# Patient Record
Sex: Male | Born: 1970 | Race: Black or African American | Hispanic: No | Marital: Married | Smoking: Former smoker
Health system: Southern US, Community
[De-identification: ages and names within clinical notes are randomized; demographics above are authoritative.]

## PROBLEM LIST (undated history)

## (undated) DIAGNOSIS — I10 Essential (primary) hypertension: Secondary | ICD-10-CM

## (undated) DIAGNOSIS — I509 Heart failure, unspecified: Secondary | ICD-10-CM

## (undated) DIAGNOSIS — J45909 Unspecified asthma, uncomplicated: Secondary | ICD-10-CM

## (undated) DIAGNOSIS — M1990 Unspecified osteoarthritis, unspecified site: Secondary | ICD-10-CM

## (undated) DIAGNOSIS — G4730 Sleep apnea, unspecified: Secondary | ICD-10-CM

## (undated) DIAGNOSIS — E119 Type 2 diabetes mellitus without complications: Secondary | ICD-10-CM

## (undated) HISTORY — DX: Unspecified asthma, uncomplicated: J45.909

---

## 1998-11-26 ENCOUNTER — Emergency Department (HOSPITAL_COMMUNITY): Admission: EM | Admit: 1998-11-26 | Discharge: 1998-11-26 | Payer: Self-pay | Admitting: Emergency Medicine

## 1998-11-26 ENCOUNTER — Encounter: Payer: Self-pay | Admitting: Emergency Medicine

## 1998-11-27 ENCOUNTER — Encounter: Payer: Self-pay | Admitting: Emergency Medicine

## 2017-05-01 ENCOUNTER — Encounter (HOSPITAL_COMMUNITY): Payer: Self-pay | Admitting: Emergency Medicine

## 2017-05-01 ENCOUNTER — Emergency Department (HOSPITAL_COMMUNITY): Payer: PRIVATE HEALTH INSURANCE

## 2017-05-01 ENCOUNTER — Inpatient Hospital Stay (HOSPITAL_COMMUNITY)
Admission: EM | Admit: 2017-05-01 | Discharge: 2017-05-03 | DRG: 291 | Disposition: A | Discharge status: Home / Self Care | Payer: PRIVATE HEALTH INSURANCE | Attending: Internal Medicine | Admitting: Internal Medicine

## 2017-05-01 DIAGNOSIS — R112 Nausea with vomiting, unspecified: Secondary | ICD-10-CM | POA: Diagnosis present

## 2017-05-01 DIAGNOSIS — I5031 Acute diastolic (congestive) heart failure: Secondary | ICD-10-CM | POA: Diagnosis present

## 2017-05-01 DIAGNOSIS — I5032 Chronic diastolic (congestive) heart failure: Secondary | ICD-10-CM

## 2017-05-01 DIAGNOSIS — A084 Viral intestinal infection, unspecified: Secondary | ICD-10-CM | POA: Diagnosis present

## 2017-05-01 DIAGNOSIS — I110 Hypertensive heart disease with heart failure: Principal | ICD-10-CM | POA: Diagnosis present

## 2017-05-01 DIAGNOSIS — R0682 Tachypnea, not elsewhere classified: Secondary | ICD-10-CM

## 2017-05-01 DIAGNOSIS — Z6841 Body Mass Index (BMI) 40.0 and over, adult: Secondary | ICD-10-CM

## 2017-05-01 DIAGNOSIS — Z8249 Family history of ischemic heart disease and other diseases of the circulatory system: Secondary | ICD-10-CM

## 2017-05-01 DIAGNOSIS — J9601 Acute respiratory failure with hypoxia: Secondary | ICD-10-CM | POA: Diagnosis present

## 2017-05-01 DIAGNOSIS — R000 Tachycardia, unspecified: Secondary | ICD-10-CM

## 2017-05-01 DIAGNOSIS — G4733 Obstructive sleep apnea (adult) (pediatric): Secondary | ICD-10-CM | POA: Diagnosis present

## 2017-05-01 DIAGNOSIS — I10 Essential (primary) hypertension: Secondary | ICD-10-CM | POA: Diagnosis present

## 2017-05-01 DIAGNOSIS — Z9981 Dependence on supplemental oxygen: Secondary | ICD-10-CM

## 2017-05-01 DIAGNOSIS — Z9119 Patient's noncompliance with other medical treatment and regimen: Secondary | ICD-10-CM

## 2017-05-01 DIAGNOSIS — Z87891 Personal history of nicotine dependence: Secondary | ICD-10-CM

## 2017-05-01 DIAGNOSIS — Z79899 Other long term (current) drug therapy: Secondary | ICD-10-CM

## 2017-05-01 DIAGNOSIS — R109 Unspecified abdominal pain: Secondary | ICD-10-CM | POA: Diagnosis present

## 2017-05-01 DIAGNOSIS — R0602 Shortness of breath: Secondary | ICD-10-CM

## 2017-05-01 DIAGNOSIS — G4730 Sleep apnea, unspecified: Secondary | ICD-10-CM

## 2017-05-01 DIAGNOSIS — F1210 Cannabis abuse, uncomplicated: Secondary | ICD-10-CM | POA: Diagnosis present

## 2017-05-01 DIAGNOSIS — R197 Diarrhea, unspecified: Secondary | ICD-10-CM

## 2017-05-01 DIAGNOSIS — R0902 Hypoxemia: Secondary | ICD-10-CM

## 2017-05-01 HISTORY — DX: Sleep apnea, unspecified: G47.30

## 2017-05-01 HISTORY — DX: Unspecified osteoarthritis, unspecified site: M19.90

## 2017-05-01 HISTORY — DX: Essential (primary) hypertension: I10

## 2017-05-01 HISTORY — DX: Heart failure, unspecified: I50.9

## 2017-05-01 LAB — CBC
HEMATOCRIT: 44.4 % (ref 39.0–52.0)
Hemoglobin: 14.5 g/dL (ref 13.0–17.0)
MCH: 27.4 pg (ref 26.0–34.0)
MCHC: 32.7 g/dL (ref 30.0–36.0)
MCV: 83.8 fL (ref 78.0–100.0)
Platelets: 220 10*3/uL (ref 150–400)
RBC: 5.3 MIL/uL (ref 4.22–5.81)
RDW: 15.4 % (ref 11.5–15.5)
WBC: 8.7 10*3/uL (ref 4.0–10.5)

## 2017-05-01 LAB — COMPREHENSIVE METABOLIC PANEL
ALT: 28 U/L (ref 17–63)
AST: 22 U/L (ref 15–41)
Albumin: 3.9 g/dL (ref 3.5–5.0)
Alkaline Phosphatase: 35 U/L — ABNORMAL LOW (ref 38–126)
Anion gap: 12 (ref 5–15)
BUN: 9 mg/dL (ref 6–20)
CHLORIDE: 102 mmol/L (ref 101–111)
CO2: 23 mmol/L (ref 22–32)
Calcium: 9.1 mg/dL (ref 8.9–10.3)
Creatinine, Ser: 0.8 mg/dL (ref 0.61–1.24)
GFR calc Af Amer: >60 mL/min (ref >60)
Glucose, Bld: 115 mg/dL — ABNORMAL HIGH (ref 65–99)
POTASSIUM: 3.9 mmol/L (ref 3.5–5.1)
SODIUM: 137 mmol/L (ref 135–145)
Total Bilirubin: 0.8 mg/dL (ref 0.3–1.2)
Total Protein: 7.7 g/dL (ref 6.5–8.1)

## 2017-05-01 LAB — LIPASE, BLOOD: Lipase: 27 U/L (ref 11–51)

## 2017-05-01 LAB — URINALYSIS, ROUTINE W REFLEX MICROSCOPIC
Bilirubin Urine: NEGATIVE
GLUCOSE, UA: NEGATIVE mg/dL
HGB URINE DIPSTICK: NEGATIVE
Ketones, ur: NEGATIVE mg/dL
LEUKOCYTES UA: NEGATIVE
Nitrite: NEGATIVE
PH: 5 (ref 5.0–8.0)
Protein, ur: NEGATIVE mg/dL
Specific Gravity, Urine: 1.023 (ref 1.005–1.030)

## 2017-05-01 LAB — D-DIMER, QUANTITATIVE: D-Dimer, Quant: 2.9 ug/mL-FEU — ABNORMAL HIGH (ref 0.00–0.50)

## 2017-05-01 MED ORDER — ONDANSETRON 4 MG PO TBDP
4.0000 mg | ORAL_TABLET | Freq: Once | ORAL | Status: DC
  Filled 2017-05-01: qty 1

## 2017-05-01 MED ORDER — ONDANSETRON 4 MG PO TBDP
4.0000 mg | ORAL_TABLET | Freq: Once | ORAL | Status: AC | PRN
  Administered 2017-05-01: 4 mg via ORAL
  Filled 2017-05-01: qty 1

## 2017-05-01 MED ORDER — LORAZEPAM 2 MG/ML IJ SOLN
1.0000 mg | Freq: Once | INTRAMUSCULAR | Status: AC
  Administered 2017-05-02: 1 mg via INTRAVENOUS
  Filled 2017-05-01: qty 1

## 2017-05-01 NOTE — ED Triage Notes (Signed)
Reports having n/v/d that started today with abdominal cramping.

## 2017-05-01 NOTE — ED Notes (Signed)
Pt given juice for fluid challenge 

## 2017-05-01 NOTE — ED Provider Notes (Cosign Needed)
[facility] Provider Note   CSN: [id number] Arrival date & time: 05/01/17  1849     History   Chief Complaint Chief Complaint  Patient presents with  . Shortness of Breath  . Generalized Body Aches  . Abdominal Pain  . Nausea    HPI William Benitez is a 47 y.o. male.  HPI  William Benitez is a 47 year old male with a history of CHF, sleep apnea, asthma and hypertension who presents to the emergency department with multiple complaints.  Patient reports that he was diagnosed with congestive heart failure a few months ago.  He takes Lasix daily, denies recent changes in his medications.  Over the past two days he states that he has felt worsening shortness of breath, particularly with laying flat.  States that his bilateral legs appear somewhat swollen as well.  He also had some associated wheezing which has improved with his inhaler.  He denies associated chest pain or cough.  Today he states that he developed gradual onset generalized abdominal pain which is now 8/10 in severity.  Reports his entire abdomen feels "tight."  He had one episode of vomiting today and has had three episodes of nonbloody diarrhea.  He denies sick contacts with similar.  States that he had subjective chills today.  [place] for his father's funeral.  He denies headache, sore throat, congestion, body aches, melena, hematochezia, he denies history of DVT/PE, calf tenderness, pleuritic chest pain, recent surgery or immobilization, history of cancer, exogenous testosterone.   Past Medical History:  Diagnosis Date  . Arthritis   . CHF (congestive heart failure) (HCC)   . Hypertension   . Sleep apnea     There are no active problems to display for this patient.   History reviewed. No pertinent surgical history.     Home Medications    Prior to Admission medications   Not on File    Family History No family history on file.  Social  History Social History   Tobacco Use  . Smoking status: Former Games developermoker  . Smokeless tobacco: Never Used  Substance Use Topics  . Alcohol use: No    Frequency: Never  . Drug use: Yes    Types: Marijuana     Allergies   Patient has no allergy information on record.   Review of Systems Review of Systems  Constitutional: Positive for chills. Negative for fever.  Eyes: Negative for visual disturbance.  Respiratory: Positive for shortness of breath and wheezing. Negative for cough.   Cardiovascular: Positive for leg swelling (bilateral). Negative for chest pain.  Gastrointestinal: Positive for abdominal pain (generalized), diarrhea, nausea and vomiting. Negative for blood in stool.  Genitourinary: Negative for dysuria, flank pain and frequency.  Musculoskeletal: Negative for gait problem.  Skin: Negative for rash.  Neurological: Negative for light-headedness and headaches.  Psychiatric/Behavioral: Negative for agitation.     Physical Exam Updated Vital Signs BP (!) 170/89   Pulse (!) 105   Temp 98.9 F (37.2 C) (Oral)   Resp (!) 25   Ht 5\' 9"  (1.753 m)   Wt (!) 157.9 kg (348 lb)   SpO2 98%   BMI 51.39 kg/m   Physical Exam  Constitutional: He is oriented to person, place, and time. He appears well-developed and well-nourished. No distress.  HENT:  Head: Normocephalic and atraumatic.  Mouth/Throat: Oropharynx is clear and moist. No oropharyngeal exudate.  Mucous membranes moist  Eyes: Conjunctivae are normal. Pupils are  equal, round, and reactive to light. Right eye exhibits no discharge. Left eye exhibits no discharge.  Neck: Normal range of motion. Neck supple.  Cardiovascular: Normal rate, regular rhythm and intact distal pulses. Exam reveals no friction rub.  No murmur heard. Pulmonary/Chest: Effort normal. No stridor. No respiratory distress. He has no wheezes. He has no rales.  No respiratory distress, speaking in full sentences. Lungs CTA.   Abdominal: Soft.  Bowel sounds are normal.  Abdomen soft and nondistended.  Generally tender to palpation throughout the abdomen.  No guarding or rigidity.  No rebound tenderness.  No fluid wave.  No CVA tenderness.  Musculoskeletal:  Bilateral legs without appreciable swelling or calf tenderness.  Neurological: He is alert and oriented to person, place, and time. Coordination normal.  Skin: Skin is warm and dry. Capillary refill takes less than 2 seconds. He is not diaphoretic.  Psychiatric: He has a normal mood and affect. His behavior is normal.  Nursing note and vitals reviewed.   ED Treatments / Results  Labs (all labs ordered are listed, but only abnormal results are displayed) Labs Reviewed  COMPREHENSIVE METABOLIC PANEL - Abnormal; Notable for the following components:      Result Value   Glucose, Bld 115 (*)    Alkaline Phosphatase 35 (*)    All other components within normal limits  D-DIMER, QUANTITATIVE (NOT AT [facility]) - Abnormal; Notable for the following components:   D-Dimer, Quant 2.90 (*)    All other components within normal limits  LIPASE, BLOOD  CBC  URINALYSIS, ROUTINE W REFLEX MICROSCOPIC  I-STAT TROPONIN, ED    EKG  EKG Interpretation  Date/Time:  Sunday May 01 2017 18:59:27 EDT Ventricular Rate:  106 PR Interval:  148 QRS Duration: 84 QT Interval:  332 QTC Calculation: 441 R Axis:   83 Text Interpretation:  Sinus tachycardia Biatrial enlargement Nonspecific T wave abnormality Abnormal ECG Confirmed by Benjiman Core [phone]) on 05/01/2017 10:36:36 PM       Radiology Dg Chest 2 View  Result Date: 05/01/2017 CLINICAL DATA:  47 year old male with shortness of breath. EXAM: CHEST - 2 VIEW COMPARISON:  None. FINDINGS: The heart size and mediastinal contours are within normal limits. Both lungs are clear. The visualized skeletal structures are unremarkable. IMPRESSION: No active cardiopulmonary disease. Electronically Signed   By: Elgie Collard M.D.   On: 05/01/2017  22:14    Procedures Procedures (including critical care time)  Medications Ordered in ED Medications  ondansetron (ZOFRAN-ODT) disintegrating tablet 4 mg (4 mg Oral Refused 05/01/17 2152)  iopamidol (ISOVUE-370) 76 % injection (not administered)  LORazepam (ATIVAN) injection 1 mg (not administered)  ondansetron (ZOFRAN-ODT) disintegrating tablet 4 mg (4 mg Oral Given 05/01/17 1911)  LORazepam (ATIVAN) injection 1 mg (1 mg Intravenous Given 05/02/17 0043)     Initial Impression / Assessment and Plan / ED Course  I have reviewed the triage vital signs and the nursing notes.  Pertinent labs & imaging results that were available during my care of the patient were reviewed by me and considered in my medical decision making (see chart for details).     Presents to the emergency department with multiple complaints.  Abdominal pain, n/v and diarrhea  This started today. Associated vomiting and diarrhea. On exam, abdomen soft and generally tender to palpation. No guarding or rigidity. No rebound. Labs reviewed. CBC, CMP, Lipase and UA unremarkable. Given symptoms started today with vomiting and diarrhea, suspect viral gastroenteritis. Patient without signs of dehydration. Given zofran in  the department and tolerating po fluids at the bedside. Have counseled patient on BRAT diet. Counseled him on strict return precautions and he agrees and voices understanding to plan.   Shortness of Breath Patient has known history of CHF. Reports his shortness of breath has been ongoing for two days now and seems to be worse with laying flat. Also endorses some wheezing, improved with albuterol. He denies chest pain. On exam he is in no apparent respiratory distress. Tachycardic. Lungs CTA. Does not appear fluid overloaded, no appreciable leg swelling.   CXR negative for acute abnormality. Plan to get ddimer given tachycardic and short of breath with clear lungs. EKG shows t-wave inversions in V5, V6 and II, III  with no previous to compare to. Awaiting troponin.   Ddimer elevated at 2.90, plan to get CT angio of chest to further evaluate for PE. Troponin negative. Given presentation and lab work do not suspect ACS. Plan to have patient follow up with his PCP regarding t-wave inversions. Discussed with Dr. Rubin Payor who agrees with the above plan.   Sign out given at shift change to [facility] for disposition following CT angio chest. If negative plan to discharge with recommendation to follow up with Cardiologist for today's ER visit.   Final Clinical Impressions(s) / ED Diagnoses   Final diagnoses:  None    ED Discharge Orders    None       Kellie Shropshire, PA-C 05/02/17 0120

## 2017-05-02 ENCOUNTER — Other Ambulatory Visit (HOSPITAL_COMMUNITY): Payer: Self-pay

## 2017-05-02 ENCOUNTER — Emergency Department (HOSPITAL_COMMUNITY): Payer: PRIVATE HEALTH INSURANCE

## 2017-05-02 ENCOUNTER — Encounter (HOSPITAL_COMMUNITY): Payer: Self-pay | Admitting: Internal Medicine

## 2017-05-02 ENCOUNTER — Inpatient Hospital Stay (HOSPITAL_COMMUNITY): Payer: PRIVATE HEALTH INSURANCE

## 2017-05-02 DIAGNOSIS — I110 Hypertensive heart disease with heart failure: Secondary | ICD-10-CM | POA: Diagnosis present

## 2017-05-02 DIAGNOSIS — I5032 Chronic diastolic (congestive) heart failure: Secondary | ICD-10-CM

## 2017-05-02 DIAGNOSIS — R0682 Tachypnea, not elsewhere classified: Secondary | ICD-10-CM | POA: Diagnosis present

## 2017-05-02 DIAGNOSIS — J9601 Acute respiratory failure with hypoxia: Secondary | ICD-10-CM

## 2017-05-02 DIAGNOSIS — G4730 Sleep apnea, unspecified: Secondary | ICD-10-CM | POA: Diagnosis present

## 2017-05-02 DIAGNOSIS — I509 Heart failure, unspecified: Secondary | ICD-10-CM | POA: Diagnosis not present

## 2017-05-02 DIAGNOSIS — I10 Essential (primary) hypertension: Secondary | ICD-10-CM | POA: Diagnosis present

## 2017-05-02 DIAGNOSIS — Z8249 Family history of ischemic heart disease and other diseases of the circulatory system: Secondary | ICD-10-CM | POA: Diagnosis not present

## 2017-05-02 DIAGNOSIS — R1084 Generalized abdominal pain: Secondary | ICD-10-CM

## 2017-05-02 DIAGNOSIS — Z79899 Other long term (current) drug therapy: Secondary | ICD-10-CM | POA: Diagnosis not present

## 2017-05-02 DIAGNOSIS — F1210 Cannabis abuse, uncomplicated: Secondary | ICD-10-CM | POA: Diagnosis present

## 2017-05-02 DIAGNOSIS — R112 Nausea with vomiting, unspecified: Secondary | ICD-10-CM | POA: Diagnosis present

## 2017-05-02 DIAGNOSIS — R197 Diarrhea, unspecified: Secondary | ICD-10-CM

## 2017-05-02 DIAGNOSIS — G4733 Obstructive sleep apnea (adult) (pediatric): Secondary | ICD-10-CM | POA: Diagnosis present

## 2017-05-02 DIAGNOSIS — R109 Unspecified abdominal pain: Secondary | ICD-10-CM | POA: Diagnosis present

## 2017-05-02 DIAGNOSIS — Z9119 Patient's noncompliance with other medical treatment and regimen: Secondary | ICD-10-CM | POA: Diagnosis not present

## 2017-05-02 DIAGNOSIS — I5031 Acute diastolic (congestive) heart failure: Secondary | ICD-10-CM | POA: Diagnosis present

## 2017-05-02 DIAGNOSIS — Z87891 Personal history of nicotine dependence: Secondary | ICD-10-CM | POA: Diagnosis not present

## 2017-05-02 DIAGNOSIS — A084 Viral intestinal infection, unspecified: Secondary | ICD-10-CM | POA: Diagnosis present

## 2017-05-02 DIAGNOSIS — Z6841 Body Mass Index (BMI) 40.0 and over, adult: Secondary | ICD-10-CM | POA: Diagnosis not present

## 2017-05-02 LAB — RAPID URINE DRUG SCREEN, HOSP PERFORMED
Amphetamines: NOT DETECTED
BARBITURATES: NOT DETECTED
Benzodiazepines: POSITIVE — AB
COCAINE: NOT DETECTED
OPIATES: NOT DETECTED
Tetrahydrocannabinol: POSITIVE — AB

## 2017-05-02 LAB — TROPONIN I: Troponin I: <0.03 ng/mL (ref <0.03)

## 2017-05-02 LAB — BRAIN NATRIURETIC PEPTIDE: B NATRIURETIC PEPTIDE 5: 48.9 pg/mL (ref 0.0–100.0)

## 2017-05-02 LAB — HIV ANTIBODY (ROUTINE TESTING W REFLEX): HIV Screen 4th Generation wRfx: NONREACTIVE

## 2017-05-02 LAB — I-STAT TROPONIN, ED: Troponin i, poc: 0 ng/mL (ref 0.00–0.08)

## 2017-05-02 LAB — INFLUENZA PANEL BY PCR (TYPE A & B)
INFLAPCR: NEGATIVE
Influenza B By PCR: NEGATIVE

## 2017-05-02 MED ORDER — ALPRAZOLAM 0.25 MG PO TABS
0.2500 mg | ORAL_TABLET | Freq: Two times a day (BID) | ORAL | Status: DC | PRN
  Administered 2017-05-02: 0.25 mg via ORAL
  Filled 2017-05-02: qty 1

## 2017-05-02 MED ORDER — SODIUM CHLORIDE 0.9 % IV SOLN: 250.0000 mL | INTRAVENOUS | Status: DC | PRN

## 2017-05-02 MED ORDER — HYDRALAZINE HCL 20 MG/ML IJ SOLN: 5.0000 mg | INTRAMUSCULAR | Status: DC | PRN

## 2017-05-02 MED ORDER — ENOXAPARIN SODIUM 40 MG/0.4ML ~~LOC~~ SOLN
40.0000 mg | SUBCUTANEOUS | Status: DC
  Administered 2017-05-02 – 2017-05-03 (×2): 40 mg via SUBCUTANEOUS
  Filled 2017-05-02 (×3): qty 0.4

## 2017-05-02 MED ORDER — LISINOPRIL 2.5 MG PO TABS: 5.0000 mg | ORAL_TABLET | Freq: Every day | ORAL | Status: DC

## 2017-05-02 MED ORDER — LEVALBUTEROL HCL 1.25 MG/0.5ML IN NEBU
1.2500 mg | INHALATION_SOLUTION | Freq: Four times a day (QID) | RESPIRATORY_TRACT | Status: DC
  Filled 2017-05-02: qty 0.5

## 2017-05-02 MED ORDER — DM-GUAIFENESIN ER 30-600 MG PO TB12: 1.0000 | ORAL_TABLET | Freq: Two times a day (BID) | ORAL | Status: DC | PRN

## 2017-05-02 MED ORDER — LEVALBUTEROL HCL 1.25 MG/0.5ML IN NEBU
1.2500 mg | INHALATION_SOLUTION | Freq: Two times a day (BID) | RESPIRATORY_TRACT | Status: DC
  Administered 2017-05-03 (×2): 1.25 mg via RESPIRATORY_TRACT
  Filled 2017-05-02 (×2): qty 0.5

## 2017-05-02 MED ORDER — HYDRALAZINE HCL 20 MG/ML IJ SOLN
10.0000 mg | Freq: Once | INTRAMUSCULAR | Status: AC
  Administered 2017-05-02: 10 mg via INTRAVENOUS
  Filled 2017-05-02: qty 1

## 2017-05-02 MED ORDER — ACETAMINOPHEN 325 MG PO TABS
650.0000 mg | ORAL_TABLET | ORAL | Status: DC | PRN
  Administered 2017-05-02 – 2017-05-03 (×2): 650 mg via ORAL
  Filled 2017-05-02 (×2): qty 2

## 2017-05-02 MED ORDER — ASPIRIN EC 81 MG PO TBEC
81.0000 mg | DELAYED_RELEASE_TABLET | Freq: Every day | ORAL | Status: DC
  Administered 2017-05-02 – 2017-05-03 (×2): 81 mg via ORAL
  Filled 2017-05-02 (×2): qty 1

## 2017-05-02 MED ORDER — ZOLPIDEM TARTRATE 5 MG PO TABS: 5.0000 mg | ORAL_TABLET | Freq: Every evening | ORAL | Status: DC | PRN

## 2017-05-02 MED ORDER — BUDESONIDE 0.25 MG/2ML IN SUSP
0.2500 mg | Freq: Two times a day (BID) | RESPIRATORY_TRACT | Status: DC
  Administered 2017-05-02 – 2017-05-03 (×3): 0.25 mg via RESPIRATORY_TRACT
  Filled 2017-05-02 (×6): qty 2

## 2017-05-02 MED ORDER — LEVALBUTEROL HCL 1.25 MG/0.5ML IN NEBU: 1.2500 mg | INHALATION_SOLUTION | Freq: Four times a day (QID) | RESPIRATORY_TRACT | Status: DC | PRN

## 2017-05-02 MED ORDER — LISINOPRIL 10 MG PO TABS
10.0000 mg | ORAL_TABLET | Freq: Every day | ORAL | Status: DC
  Administered 2017-05-02 – 2017-05-03 (×2): 10 mg via ORAL
  Filled 2017-05-02 (×2): qty 1

## 2017-05-02 MED ORDER — LEVALBUTEROL HCL 1.25 MG/0.5ML IN NEBU
0.6300 mg | INHALATION_SOLUTION | Freq: Four times a day (QID) | RESPIRATORY_TRACT | Status: DC
  Administered 2017-05-02 (×2): 0.63 mg via RESPIRATORY_TRACT
  Filled 2017-05-02: qty 0.5
  Filled 2017-05-02 (×3): qty 0.26

## 2017-05-02 MED ORDER — IOPAMIDOL (ISOVUE-370) INJECTION 76%
INTRAVENOUS | Status: AC
  Administered 2017-05-02: 100 mL
  Filled 2017-05-02: qty 100

## 2017-05-02 MED ORDER — IOPAMIDOL (ISOVUE-300) INJECTION 61%
INTRAVENOUS | Status: AC
  Administered 2017-05-02: 18:00:00
  Filled 2017-05-02: qty 30

## 2017-05-02 MED ORDER — MORPHINE SULFATE (PF) 4 MG/ML IV SOLN: 2.0000 mg | INTRAVENOUS | Status: DC | PRN

## 2017-05-02 MED ORDER — BECLOMETHASONE DIPROPIONATE 40 MCG/ACT IN AERS: 1.0000 | INHALATION_SPRAY | Freq: Two times a day (BID) | RESPIRATORY_TRACT | Status: DC

## 2017-05-02 MED ORDER — SODIUM CHLORIDE 0.9% FLUSH: 3.0000 mL | INTRAVENOUS | Status: DC | PRN

## 2017-05-02 MED ORDER — FUROSEMIDE 10 MG/ML IJ SOLN
40.0000 mg | Freq: Two times a day (BID) | INTRAMUSCULAR | Status: DC
  Administered 2017-05-02 – 2017-05-03 (×3): 40 mg via INTRAVENOUS
  Filled 2017-05-02 (×4): qty 4

## 2017-05-02 MED ORDER — SODIUM CHLORIDE 0.9% FLUSH
3.0000 mL | Freq: Two times a day (BID) | INTRAVENOUS | Status: DC
  Administered 2017-05-02 – 2017-05-03 (×3): 3 mL via INTRAVENOUS

## 2017-05-02 MED ORDER — NITROGLYCERIN 0.4 MG SL SUBL: 0.4000 mg | SUBLINGUAL_TABLET | SUBLINGUAL | Status: DC | PRN

## 2017-05-02 MED ORDER — LORAZEPAM 2 MG/ML IJ SOLN
1.0000 mg | Freq: Once | INTRAMUSCULAR | Status: AC
  Administered 2017-05-02: 1 mg via INTRAVENOUS
  Filled 2017-05-02: qty 1

## 2017-05-02 MED ORDER — ONDANSETRON HCL 4 MG/2ML IJ SOLN: 4.0000 mg | Freq: Four times a day (QID) | INTRAMUSCULAR | Status: DC | PRN

## 2017-05-02 NOTE — ED Notes (Signed)
Cranberry juice 

## 2017-05-02 NOTE — ED Notes (Signed)
Pt's O2 sat noted to be in the 80's.  Pt w/ hx OSA.  2L O2 via  applied to pt w/ an improvement in O2 sats.

## 2017-05-02 NOTE — ED Notes (Signed)
Dinner tray delivered.

## 2017-05-02 NOTE — Care Management Note (Signed)
Case Management Note  Patient Details  Name: William Benitez MRN: [id number] Date of Birth: 1970-04-05  Subjective/Objective:                  47 y.o. male with medical history significant of hypertension, CHF, OSA on CPAP, marijuana abuse, morbid obesity, who presents with shortness breath, nausea, vomiting, diarrhea, abdominal pain. From home [facility]  Action/Plan: Admit status INPATIENT (ACUTE RESP FAILURE); anticipate discharge HOME WITH SELF CARE.   Expected Discharge Date:  (unknown)               Expected Discharge Plan:  Home/Self Care  In-House Referral:     Discharge planning Services  CM Consult  Post Acute Care Choice:    Choice offered to:     DME Arranged:    DME Agency:     HH Arranged:    HH Agency:     Status of Service:  In process, will continue to follow  If discussed at Long Length of Stay Meetings, dates discussed:    Additional Comments:  Oletta CohnWood, Matteson Blue, RN 05/02/2017, 11:31 AM

## 2017-05-02 NOTE — Progress Notes (Signed)
   Follow Up Note  HPI: 47 year old male past medical history of hypertension, morbid obesity, obstructive sleep apnea and diastolic CHF admitted for progressively worsening shortness of breath of unclear etiology.  Chest x-ray unrevealing for pneumonia.  Started on IV Lasix, admitted to the hospitalist service.  Starting to feel better  Pt admitted earlier this morning.  Seen after arrived to floor.    Exam: CV: Regular rate and rhythm, S1-S2, 2 out of 6 systolic ejection murmur Lungs: Decreased breath sounds throughout Abd: Soft, obese, nontender, positive bowel sounds Ext: 1-2+ pitting edema  Present on Admission: . Sleep apnea: Noncompliant with CPAP, will start nightly CPAP. Marland Kitchen. Hypertension: Improved as he is diuresing . Acute respiratory failure with hypoxia (HCC) To be secondary to acute heart failure: No echocardiogram on chart, will check.  Continue IV Lasix . Abdominal pain: CT scan of abdomen and pelvis unremarkable. . Nausea, vomiting and diarrhea: Flu titer negative   Disposition: Here for several days and to fully diuresed, breathing stabilized

## 2017-05-02 NOTE — H&P (Signed)
History and Physical    William Benitez ZOX:[id number] DOB: [birth year] DOA: 05/01/2017  Referring MD/NP/PA:   PCP: Patient, No Pcp Per   Patient coming from:  The patient is coming from home.  At baseline, pt is independent for most of ADL  Chief Complaint: Shortness of breath, CP, nausea, vomiting, diarrhea, abdominal pain  HPI: William Benitez is a 47 y.o. male with medical history significant of hypertension, CHF, OSA on CPAP, marijuana abuse, morbid obesity, who presents with shortness breath, nausea, vomiting, diarrhea, abdominal pain.  Patient states that he has been having shortness of breath in the past several days. He also reports chest pain, which is located in the substernal area, initially 9 out of 10 in severity, currently chest pain-free, pressure-like, nonradiating. No tenderness in the calf areas. Patient states that he nausea, vomiting, diarrhea and abdominal pain. He vomited once, and has 3 watery diarrhea. No recent antibiotics use. His abdominal pain is diffuse, constant, 7 out of 10 in severity, sharp, nonradiating. No symptoms of UTI or unilateral weakness. Patient states that he is a taking Lasix 40 mg twice a day, but he has gained 8 pounds weight recently.  ED Course: pt was found to have WBC 8.7, negative troponin, negative urinalysis, negative flu PCR, electrolytes renal function okay, temperature 90.9, tachycardia, tachypnea, oxygen saturation 80% on room air, negative chest x-ray. D-dimer positive, but CT angiogram is negative for PE. Patient is admitted to telemetry bed as inpatient.    Review of Systems:   General: no fevers, chills, has body weight gain, has poor appetite, has fatigue HEENT: no blurry vision, hearing changes or sore throat Respiratory: has dyspnea, coughing, no  wheezing CV: has chest pain, no palpitations GI: has nausea, vomiting, abdominal pain, diarrhea, no constipation GU: no dysuria, burning on urination, increased urinary frequency,  hematuria  Ext: has mild leg edema Neuro: no unilateral weakness, numbness, or tingling, no vision change or hearing loss Skin: no rash, no skin tear. MSK: No muscle spasm, no deformity, no limitation of range of movement in spin Heme: No easy bruising.  Travel history: No recent long distant travel.  Allergy: No Known Allergies  Past Medical History:  Diagnosis Date  . Arthritis   . CHF (congestive heart failure) (HCC)   . Hypertension   . Sleep apnea     History reviewed. No pertinent surgical history.  Social History:  reports that he has quit smoking. he has never used smokeless tobacco. He reports that he uses drugs. Drug: Marijuana. He reports that he does not drink alcohol.  Family History:  Family History  Problem Relation Age of Onset  . Hypertension Mother   . Liver disease Mother   . Kidney disease Father      Prior to Admission medications   Medication Sig Start Date End Date Taking? Authorizing Provider  albuterol (PROVENTIL HFA;VENTOLIN HFA) 108 (90 Base) MCG/ACT inhaler Inhale 1-2 puffs into the lungs every 6 (six) hours as needed for wheezing or shortness of breath.   Yes [provider]  beclomethasone (QVAR) 40 MCG/ACT inhaler Inhale 1 puff into the lungs 2 (two) times daily.   Yes [provider]  furosemide (LASIX) 40 MG tablet Take 40 mg by mouth 2 (two) times daily.   Yes [provider]    Physical Exam: Vitals:   05/02/17 0430 05/02/17 0445 05/02/17 0515 05/02/17 0530  BP: (!) 175/105 (!) 158/84 (!) 146/96 (!) 143/80  Pulse:  (!) 124 (!) 133 (!) 135  Resp: 15 (!) 35 (!) 34 15  Temp:      TempSrc:      SpO2:  100% 98% 99%  Weight:      Height:       General: Not in acute distress HEENT:       Eyes: PERRL, EOMI, no scleral icterus.       ENT: No discharge from the ears and nose, no pharynx injection, no tonsillar enlargement.        Neck: Difficult to assess JVD, no bruit, no mass felt. Heme: No neck lymph node  enlargement. Cardiac: S1/S2, RRR, No murmurs, No gallops or rubs. Respiratory: No rales, wheezing, rhonchi or rubs. GI: Soft, nondistended, nontender, no rebound pain, no organomegaly, BS present. GU: No hematuria Ext: has trace leg edema bilaterally. 2+DP/PT pulse bilaterally. Musculoskeletal: No joint deformities, No joint redness or warmth, no limitation of ROM in spin. Skin: No rashes.  Neuro: Alert, oriented X3, cranial nerves II-XII grossly intact, moves all extremities normally. Psych: Patient is not psychotic, no suicidal or hemocidal ideation.  Labs on Admission: I have personally reviewed following labs and imaging studies  CBC: Recent Labs  Lab 05/01/17 1918  WBC 8.7  HGB 14.5  HCT 44.4  MCV 83.8  PLT 220   Basic Metabolic Panel: Recent Labs  Lab 05/01/17 1918  NA 137  K 3.9  CL 102  CO2 23  GLUCOSE 115*  BUN 9  CREATININE 0.80  CALCIUM 9.1   GFR: Estimated Creatinine Clearance: 176 mL/min (by C-G formula based on SCr of 0.8 mg/dL). Liver Function Tests: Recent Labs  Lab 05/01/17 1918  AST 22  ALT 28  ALKPHOS 35*  BILITOT 0.8  PROT 7.7  ALBUMIN 3.9   Recent Labs  Lab 05/01/17 1918  LIPASE 27   No results for input(s): AMMONIA in the last 168 hours. Coagulation Profile: No results for input(s): INR, PROTIME in the last 168 hours. Cardiac Enzymes: No results for input(s): CKTOTAL, CKMB, CKMBINDEX, TROPONINI in the last 168 hours. BNP (last 3 results) No results for input(s): PROBNP in the last 8760 hours. HbA1C: No results for input(s): HGBA1C in the last 72 hours. CBG: No results for input(s): GLUCAP in the last 168 hours. Lipid Profile: No results for input(s): CHOL, HDL, LDLCALC, TRIG, CHOLHDL, LDLDIRECT in the last 72 hours. Thyroid Function Tests: No results for input(s): TSH, T4TOTAL, FREET4, T3FREE, THYROIDAB in the last 72 hours. Anemia Panel: No results for input(s): VITAMINB12, FOLATE, FERRITIN, TIBC, IRON, RETICCTPCT in the  last 72 hours. Urine analysis:    Component Value Date/Time   COLORURINE YELLOW 05/01/2017 1919   APPEARANCEUR CLEAR 05/01/2017 1919   LABSPEC 1.023 05/01/2017 1919   PHURINE 5.0 05/01/2017 1919   GLUCOSEU NEGATIVE 05/01/2017 1919   HGBUR NEGATIVE 05/01/2017 1919   BILIRUBINUR NEGATIVE 05/01/2017 1919   KETONESUR NEGATIVE 05/01/2017 1919   PROTEINUR NEGATIVE 05/01/2017 1919   NITRITE NEGATIVE 05/01/2017 1919   LEUKOCYTESUR NEGATIVE 05/01/2017 1919   Sepsis Labs: @LABRCNTIP (procalcitonin:4,lacticidven:4) )No results found for this or any previous visit (from the past 240 hour(s)).   Radiological Exams on Admission: Dg Chest 2 View  Result Date: 05/01/2017 CLINICAL DATA:  47 year old male with shortness of breath. EXAM: CHEST - 2 VIEW COMPARISON:  None. FINDINGS: The heart size and mediastinal contours are within normal limits. Both lungs are clear. The visualized skeletal structures are unremarkable. IMPRESSION: No active cardiopulmonary disease. Electronically Signed   By: Elgie CollardArash  Radparvar M.D.   On: 05/01/2017 22:14  Ct Angio Chest Pe W And/or Wo Contrast  Result Date: 05/02/2017 CLINICAL DATA:  Elevated D-dimer.  Assess for pulmonary embolus. EXAM: CT ANGIOGRAPHY CHEST WITH CONTRAST TECHNIQUE: Multidetector CT imaging of the chest was performed using the standard protocol during bolus administration of intravenous contrast. Multiplanar CT image reconstructions and MIPs were obtained to evaluate the vascular anatomy. CONTRAST:  ISOVUE-370 IOPAMIDOL (ISOVUE-370) INJECTION 76% COMPARISON:  Chest radiograph performed 05/01/2017 FINDINGS: Cardiovascular:  There is no evidence of pulmonary embolus. The heart is normal in size. The thoracic aorta is unremarkable. The great vessels are within normal limits. Mediastinum/Nodes: The mediastinum is unremarkable in appearance. No mediastinal lymphadenopathy is seen. No pericardial effusion is identified. The visualized portions of the thyroid  gland are unremarkable. No axillary lymphadenopathy is seen. Lungs/Pleura: A small calcified granuloma is noted at the right lung base. The lungs are otherwise clear. No focal consolidation, pleural effusion or pneumothorax is seen. No masses are identified. Upper Abdomen: The visualized portions of the liver and spleen are unremarkable. Musculoskeletal: No acute osseous abnormalities are identified. The visualized musculature is unremarkable in appearance. Review of the MIP images confirms the above findings. IMPRESSION: No evidence of pulmonary embolus.  Lungs clear bilaterally. Electronically Signed   By: Roanna Raider M.D.   On: 05/02/2017 01:40     EKG: Independently reviewed.  Sinus rhythm, QTC 441, LAE, mild T-wave inversion in inferior leads, V5 to V6  Assessment/Plan Principal Problem:   Acute respiratory failure with hypoxia (HCC) Active Problems:   CHF (congestive heart failure) (HCC)   Sleep apnea   Hypertension   Abdominal pain   Nausea, vomiting and diarrhea   Acute respiratory failure with hypoxia (HCC): Etiology is not clear. CT angiogram is negative for PE. Flu is negative. No fever or leukocytosis. Patient has history of CHF, but no 2-D echo on record, not sure which type of CHF. Given history of hypertension, possibly diastolic congestive heart failure. Patient states that he has gain 8 pounds recently, indicating possible CHF exacerbation. It is very difficult to assess volume status due to morbid obesity. Will treat patient empirically has CHF exacerbation. Currently no CP.  -will admit to tele bed as inpt. -Lasix 40 mg bid by IV -trop x 3 -2d echo -start ASA -start lisinopril 10 mg daily -Daily weights -strict I/O's -Low salt diet  Hx of CHF (congestive heart failure) (HCC): diagnosed in [facility] [place] 10/[place]. -will request medical records -See above  OSA: -CPAP  HTN: bp is 175/105 -start lisinopril 10 mg daily -IV hydralazine when  necessary  Abdominal pain, Nausea, vomiting and diarrhea: Possible viral gastroenteritis. No fever or leukocytosis. No recent antibiotics use. Low suspicion for C. difficile colitis. -When necessary Zofran for nausea, -CT-abd/pelvis without contrast (since pt just had CAT)   DVT ppx: SQ Lovenox Code Status: Full code Family Communication: None at bed side.    Disposition Plan:  Anticipate discharge back to previous home environment Consults called:  none Admission status: Inpatient/tele      Date of Service 05/02/2017    Lorretta Harp [facility] Pager [phone]  If 7PM-7AM, please contact night-coverage [email or web address] Password [facility] 05/02/2017, 5:52 AM

## 2017-05-02 NOTE — ED Notes (Signed)
Ordered pt a dinner tray 

## 2017-05-02 NOTE — ED Provider Notes (Signed)
[facility].  Please see her full H&P.  In short,  William Benitez is a 47 y.o. male presents for multiple complaints.  He has a history of CHF, sleep apnea, asthma and hypertension.  He is here today with shortness of breath, worse with laying flat and associated wheezing.  Additionally, he has generalized abdominal pain with one episode of vomiting and 3 episodes of diarrhea.  No known sick contacts.    Physical Exam  BP (!) 170/89   Pulse (!) 105   Temp 98.9 F (37.2 C) (Oral)   Resp (!) 25   Ht 5\' 9"  (1.753 m)   Wt (!) 157.9 kg (348 lb)   SpO2 98%   BMI 51.39 kg/m   Physical Exam  Constitutional: He appears well-developed and well-nourished. No distress.  HENT:  Head: Normocephalic.  Eyes: Conjunctivae are normal. No scleral icterus.  Neck: Normal range of motion.  Cardiovascular: Intact distal pulses. Tachycardia present.  Pulses:      Radial pulses are 2+ on the right side, and 2+ on the left side.  Pulmonary/Chest: Effort normal.  Musculoskeletal: Normal range of motion.  Neurological: He is alert.  Skin: Skin is warm and dry.  Nursing note and vitals reviewed.   ED Course/Procedures   Clinical Course as of May 02 604  Mon May 02, 2017  0206 Plan: Patient is pending CT angiogram for shortness of breath, tachycardia and elevated d-dimer at 2.90.  [HM]  0226 Patient now with worsening tachycardia and tachypnea.  Hypoxic to 80% on room air and requiring oxygen, 3lpm via [place].  He is difficult to arouse. Pulse Rate: (!) 130 [HM]  0227 CT scan is without evidence of pulmonary embolism or pulmonary edema. CT Angio Chest PE W and/or Wo Contrast [HM]  0424 Patient is significantly more arousable now.  He is able to stand and take several steps in the room without difficulty.  However, he remains significantly tachycardic, tachypneic and hypertensive.  He denies chest pain or shortness of breath at this time.  He also continues to require oxygen.  Repeat breath  sounds are without wheezing, rhonchi or rales.  Will give additional dose of hydralazine.  [HM]  [phone] Discussed with Dr. Clyde LundborgNiu who will admit  [HM]    Clinical Course User Index [HM] Zae Kirtz, Boyd KerbsHannah, PA-C    Procedures  MDM    Patient presents with shortness of breath, worse with lying flat onset several days ago.  Initial workup was negative for acute coronary syndrome with reassuring EKG and negative troponin.  Patient denies chest pain.  No additional emesis here in the emergency department.  Patient did have an elevated d-dimer at 2.9.  Secondary to this and his tachycardia a CT angiogram of his chest was obtained.  There is no evidence of pulmonary edema or pulmonary embolism.  However, patient continues to be tachycardic and significantly tachypneic.  His breath sounds are nonfocal.  He continues to require oxygen at 3 L/min via nasal cannula to prevent hypoxia.  He was given hydralazine for his hypertension without significant improvement.  Will re-dose.  Patient is afebrile.  Influenza screen is negative.  He will need admission for further evaluation.    SOB (shortness of breath)  Tachycardia  Tachypnea  Hypoxia  Essential hypertension  Oxygen dependent  Sleep apnea, unspecified type    Dierdre ForthMuthersbaugh, Oluwatobi Ruppe, PA-C 05/02/17 0606    Gilda CreasePollina, Christopher J, MD 05/02/17 [phone]

## 2017-05-02 NOTE — ED Notes (Signed)
Pt requested something for his nerves before going to CT, up on side of bed to void

## 2017-05-02 NOTE — ED Notes (Signed)
Patient transported to CT 

## 2017-05-02 NOTE — Discharge Instructions (Signed)
The scan of your chest _____. Blood work was reassuring.   Please follow up with your cardiologist regarding today's ER visit.   For your stomach pain and nausea, I have written a prescription for Zofran _____. Please take this as needed for nausea. Please also stay away from greasy or spicy foods.   Your blood pressure was elevated in the ER today, please have this rechecked by your regular doctor.   Return to the ER if you have worsening shortness of breath, chest pain, worsening abdominal pain or vomiting that will not stop.

## 2017-05-02 NOTE — ED Notes (Signed)
ED Provider at bedside. 

## 2017-05-03 ENCOUNTER — Inpatient Hospital Stay (HOSPITAL_COMMUNITY): Payer: PRIVATE HEALTH INSURANCE

## 2017-05-03 ENCOUNTER — Other Ambulatory Visit (HOSPITAL_COMMUNITY): Payer: PRIVATE HEALTH INSURANCE

## 2017-05-03 DIAGNOSIS — I5031 Acute diastolic (congestive) heart failure: Secondary | ICD-10-CM

## 2017-05-03 LAB — ECHOCARDIOGRAM COMPLETE
HEIGHTINCHES: 69 in
Weight: 5449.77 oz

## 2017-05-03 LAB — BASIC METABOLIC PANEL
Anion gap: 10 (ref 5–15)
BUN: 10 mg/dL (ref 6–20)
CALCIUM: 8.8 mg/dL — AB (ref 8.9–10.3)
CO2: 27 mmol/L (ref 22–32)
CREATININE: 0.93 mg/dL (ref 0.61–1.24)
Chloride: 98 mmol/L — ABNORMAL LOW (ref 101–111)
GFR calc non Af Amer: >60 mL/min (ref >60)
Glucose, Bld: 144 mg/dL — ABNORMAL HIGH (ref 65–99)
Potassium: 3.3 mmol/L — ABNORMAL LOW (ref 3.5–5.1)
Sodium: 135 mmol/L (ref 135–145)

## 2017-05-03 MED ORDER — FUROSEMIDE 40 MG PO TABS
40.0000 mg | ORAL_TABLET | Freq: Once | ORAL | Status: AC
  Administered 2017-05-03: 40 mg via ORAL
  Filled 2017-05-03: qty 1

## 2017-05-03 MED ORDER — LISINOPRIL 5 MG PO TABS: 5.0000 mg | ORAL_TABLET | Freq: Every day | ORAL | 1 refills | Status: DC

## 2017-05-03 NOTE — Discharge Summary (Signed)
Discharge Summary  William Benitez VOZ:[id number] DOB: 29-Jul-1970  PCP: Patient, No Pcp Per  Admit date: 05/02/2017 Discharge date: 05/03/2017  Time spent: 25 minutes   Recommendations for Outpatient Follow-up:  1. Pt will follow up with his Cardiologist [place] (where he resides) in the next 1-2 weeks  Discharge Diagnoses:  Active Hospital Problems   Diagnosis Date Noted  . Acute respiratory failure with hypoxia (HCC) 05/02/2017  . Abdominal pain 05/02/2017  . Nausea, vomiting and diarrhea 05/02/2017  . Sleep apnea   . Hypertension   . CHF (congestive heart failure) [facility](HCC)     Resolved Hospital Problems  No resolved problems to display.    Discharge Condition: Improved, being discharged to home   Diet recommendation: Heart healthy   Vitals:   05/03/17 0834 05/03/17 1348  BP:  130/67  Pulse:  (!) 110  Resp:  20  Temp:  98.6 F (37 C)  SpO2: 95% 100%    History of present illness:  47 year old male with past medical history morbid obesity, obstructive sleep apnea not on BiPAP/CPAP, CHF and marijuana use presented to the emergency room on the night of 3/10 with complaints of shortness of breath nausea and vomiting and abdominal pain.  He also reported 3 loose stools.  Workup for flu negative.  Suspected that he had viral gastroenteritis.  Patient also noted to be hypoxic.  He was diagnosed 6 months prior with heart failure at his hospital in [place].  Has been on p.o. Lasix, and despite this reported 8 pound weight gain over the last few weeks.  Patient brought in for treatment of acute heart failure.  Hospital Course:  Principal Problem:   Acute respiratory failure with hypoxia (HCC) secondary to acute diastolic heart failure: Patient underwent echocardiogram which noted preserved ejection fraction and did not comment on diastolic dysfunction.  He did however respond very well to Lasix diuresing almost 2 L.  By day of discharge, patient looked to be fully diuresed and breathing  comfortably on room air even with ambulation.  He plans to move down here soon.  He ran out of Lasix so gave him a prescription for that as well as his other medications.  Urged him to establish with PCP and cardiology after he returns.  In the meantime, he will follow-up with his cardiologist back in [place] as he is headed back there shortly. Active Problems:    Sleep apnea: Has also been noncompliant with CPAP as he does not like the mask.  I urged him to find a mask that he can tolerate this will greatly help his breathing.   Hypertension: Following diuresis, blood pressure much better controlled.  Started him on lisinopril, prescription given   Abdominal pain: Resolved after admission   Nausea, vomiting and diarrhea: Likely secondary to viral gastroenteritis, resolved after admission   Procedures:  Echocardiogram done 3/12: Diastolic dysfunction not commented on.  Preserved ejection fraction  Consultations:  None  Discharge Exam: BP 130/67 (BP Location: Right Arm)   Pulse (!) 110   Temp 98.6 F (37 C) (Oral)   Resp 20   Ht 5\' 9"  (1.753 m)   Wt (!) 154.5 kg (340 lb 9.8 oz)   SpO2 100%   BMI 50.30 kg/m   General: Alert and oriented x3, no acute distress Cardiovascular: Regular rate and rhythm, S1-S2 Respiratory: Clear to auscultation bilaterally  Discharge Instructions You were cared for by a hospitalist during your hospital stay. If you have any questions about your discharge medications  or the care you received while you were in the hospital after you are discharged, you can call the unit and asked to speak with the hospitalist on call if the hospitalist that took care of you is not available. Once you are discharged, your primary care physician will handle any further medical issues. Please note that NO REFILLS for any discharge medications will be authorized once you are discharged, as it is imperative that you return to your primary care physician (or establish a  relationship with a primary care physician if you do not have one) for your aftercare needs so that they can reassess your need for medications and monitor your lab values.  Discharge Instructions    Diet - low sodium heart healthy   Complete by:  As directed    Increase activity slowly   Complete by:  As directed      Allergies as of 05/03/2017   No Known Allergies     Medication List    TAKE these medications   albuterol 108 (90 Base) MCG/ACT inhaler Commonly known as:  PROVENTIL HFA;VENTOLIN HFA Inhale 1-2 puffs into the lungs every 6 (six) hours as needed for wheezing or shortness of breath.   atorvastatin 40 MG tablet Commonly known as:  LIPITOR Take 40 mg by mouth daily.   beclomethasone 40 MCG/ACT inhaler Commonly known as:  QVAR Inhale 1 puff into the lungs 2 (two) times daily.   carvedilol 12.5 MG tablet Commonly known as:  COREG Take 12.5 mg by mouth 2 (two) times daily with a meal.   fluticasone-salmeterol 115-21 MCG/ACT inhaler Commonly known as:  ADVAIR HFA Inhale 2 puffs into the lungs 2 (two) times daily.   furosemide 40 MG tablet Commonly known as:  LASIX Take 40 mg by mouth 2 (two) times daily.   lisinopril 5 MG tablet Commonly known as:  PRINIVIL,ZESTRIL Take 1 tablet (5 mg total) by mouth daily. Start taking on:  05/04/2017   sildenafil 20 MG tablet Commonly known as:  REVATIO Take 20 mg by mouth 3 (three) times daily. Take 3 to 5 tablets as needed one hour prior to sexual activity as needed.      No Known Allergies Follow-up Information    [facility] EMERGENCY DEPARTMENT Follow up.   Specialty:  Emergency Medicine Why:  Return to the ER with new or wosening symptoms or new conerns. Contact information: [address] [id number] mc [place] [address] [phone]           The results of significant diagnostics from this hospitalization (including imaging, microbiology, ancillary and  laboratory) are listed below for reference.    Significant Diagnostic Studies: Ct Abdomen Pelvis Wo Contrast  Result Date: 05/02/2017 CLINICAL DATA:  Diffuse abdominal pain and nausea and vomiting for 2 days. EXAM: CT ABDOMEN AND PELVIS WITHOUT CONTRAST TECHNIQUE: Multidetector CT imaging of the abdomen and pelvis was performed following the standard protocol without IV contrast. COMPARISON:  None. FINDINGS: Lower chest: No acute findings. Hepatobiliary: No mass visualized on this unenhanced exam. Tiny gallstones are seen, however there is no evidence of cholecystitis or biliary ductal dilatation. Pancreas: No mass or inflammatory process visualized on this unenhanced exam. Spleen:  Within normal limits in size. Adrenals/Urinary tract: No evidence of urolithiasis or hydronephrosis. Unremarkable appearance of bladder. Stomach/Bowel: No evidence of obstruction, inflammatory process, or abnormal fluid collections. Normal appendix visualized. Vascular/Lymphatic: No pathologically enlarged lymph nodes identified. No evidence of abdominal aortic aneurysm. Reproductive: Poor visualization due to  patient habitus and beam hardening artifact from left hip surgical hardware. Other:  None. Musculoskeletal:  No suspicious bone lesions identified. IMPRESSION: Cholelithiasis. No radiographic evidence of cholecystitis or other acute findings. Electronically Signed   By: Myles Rosenthal M.D.   On: 05/02/2017 09:00   Dg Chest 2 View  Result Date: 05/01/2017 CLINICAL DATA:  47 year old male with shortness of breath. EXAM: CHEST - 2 VIEW COMPARISON:  None. FINDINGS: The heart size and mediastinal contours are within normal limits. Both lungs are clear. The visualized skeletal structures are unremarkable. IMPRESSION: No active cardiopulmonary disease. Electronically Signed   By: Elgie Collard M.D.   On: 05/01/2017 22:14   Ct Angio Chest Pe W And/or Wo Contrast  Result Date: 05/02/2017 CLINICAL DATA:  Elevated D-dimer.   Assess for pulmonary embolus. EXAM: CT ANGIOGRAPHY CHEST WITH CONTRAST TECHNIQUE: Multidetector CT imaging of the chest was performed using the standard protocol during bolus administration of intravenous contrast. Multiplanar CT image reconstructions and MIPs were obtained to evaluate the vascular anatomy. CONTRAST:  ISOVUE-370 IOPAMIDOL (ISOVUE-370) INJECTION 76% COMPARISON:  Chest radiograph performed 05/01/2017 FINDINGS: Cardiovascular:  There is no evidence of pulmonary embolus. The heart is normal in size. The thoracic aorta is unremarkable. The great vessels are within normal limits. Mediastinum/Nodes: The mediastinum is unremarkable in appearance. No mediastinal lymphadenopathy is seen. No pericardial effusion is identified. The visualized portions of the thyroid gland are unremarkable. No axillary lymphadenopathy is seen. Lungs/Pleura: A small calcified granuloma is noted at the right lung base. The lungs are otherwise clear. No focal consolidation, pleural effusion or pneumothorax is seen. No masses are identified. Upper Abdomen: The visualized portions of the liver and spleen are unremarkable. Musculoskeletal: No acute osseous abnormalities are identified. The visualized musculature is unremarkable in appearance. Review of the MIP images confirms the above findings. IMPRESSION: No evidence of pulmonary embolus.  Lungs clear bilaterally. Electronically Signed   By: Roanna Raider M.D.   On: 05/02/2017 01:40    Microbiology: No results found for this or any previous visit (from the past 240 hour(s)).   Labs: Basic Metabolic Panel: Recent Labs  Lab 05/01/17 1918 05/03/17 0534  NA 137 135  K 3.9 3.3*  CL 102 98*  CO2 23 27  GLUCOSE 115* 144*  BUN 9 10  CREATININE 0.80 0.93  CALCIUM 9.1 8.8*   Liver Function Tests: Recent Labs  Lab 05/01/17 1918  AST 22  ALT 28  ALKPHOS 35*  BILITOT 0.8  PROT 7.7  ALBUMIN 3.9   Recent Labs  Lab 05/01/17 1918  LIPASE 27   No results  for input(s): AMMONIA in the last 168 hours. CBC: Recent Labs  Lab 05/01/17 1918  WBC 8.7  HGB 14.5  HCT 44.4  MCV 83.8  PLT 220   Cardiac Enzymes: Recent Labs  Lab 05/02/17 0543 05/02/17 1129 05/02/17 1807  TROPONINI <0.03 <0.03 <0.03   BNP: BNP (last 3 results) Recent Labs    05/02/17 0543  BNP 48.9    ProBNP (last 3 results) No results for input(s): PROBNP in the last 8760 hours.  CBG: No results for input(s): GLUCAP in the last 168 hours.     Signed:  Hollice Espy, MD [facility] 05/03/2017, 6:04 PM

## 2017-05-03 NOTE — Plan of Care (Signed)
  Progressing Clinical Measurements: Ability to maintain clinical measurements within normal limits will improve 05/03/2017 0257 - Progressing by Burtis Junesrewery, Symphoni Helbling, RN Diagnostic test results will improve 05/03/2017 0257 - Progressing by Burtis Junesrewery, Reilley Latorre, RN Respiratory complications will improve 05/03/2017 0257 - Progressing by Burtis Junesrewery, Cashus Halterman, RN Cardiovascular complication will be avoided 05/03/2017 0257 - Progressing by Burtis Junesrewery, Morrie Daywalt, RN Activity: Risk for activity intolerance will decrease 05/03/2017 0257 - Progressing by Burtis Junesrewery, Jolana Runkles, RN

## 2017-05-03 NOTE — Progress Notes (Signed)
  Echocardiogram 2D Echocardiogram has been performed.  William SkeenVijay  Nekayla Benitez 05/03/2017, 2:26 PM

## 2017-05-03 NOTE — Care Management Note (Signed)
Case Management Note  Patient Details  Name: William Benitez MRN: [id number] Date of Birth: February 22, 1971  Subjective/Objective:      CHF, OSA              Action/Plan: NCM spoke to pt. His PCP is in [place]. He is currently on Long Term Disability with his job. His plan is to move [place]. Explained he will need to check with his insurance plan with in network providers in [place]. Explained he will need to establish with a PCP here in [place]. Pt states he plans to take medications and wear CPAP as prescribed. States he does not follow a heart healthy diet or do daily weights. Explained importance of daily weights.    Expected Discharge Date:  05/03/2017               Expected Discharge Plan:  Home/Self Care  In-House Referral:  NA  Discharge planning Services  CM Consult  Post Acute Care Choice:  NA Choice offered to:  NA  DME Arranged:  N/A DME Agency:  NA  HH Arranged:  NA HH Agency:  NA  Status of Service:  Completed, signed off  If discussed at Long Length of Stay Meetings, dates discussed:    Additional Comments:  Elliot CousinShavis, Satina Jerrell Ellen, RN 05/03/2017, 2:55 PM

## 2017-05-03 NOTE — Progress Notes (Signed)
Carollee MassedMarlan Spainhour to be D/C'd home per MD order. Discussed with the patient and all questions fully answered.  VVS, Skin clean, dry and intact without evidence of skin break down, no evidence of skin tears noted.  IV catheter discontinued intact. Site without signs and symptoms of complications. Dressing and pressure applied.  An After Visit Summary was printed and given to the patient.  Patient escorted via WC, and D/C home via private auto.  Jon Gillslisa R Blaire Palomino  05/03/2017 6:10 PM

## 2017-05-03 NOTE — Progress Notes (Signed)
Patient removed cpap but still wearing oxygen while resting.

## 2017-05-03 NOTE — Progress Notes (Signed)
Pt passed his walking O2 saturation test. Pt's O2 sats maintained between 98-100% on room air while walking 100 ft pushing dinamap machine.

## 2017-05-03 NOTE — Progress Notes (Signed)
Patient admitted to room 5w19 from 5c09. Patient alert and oriented x4. Respiration even unlabored 2L oxygen via [place]. Ambulates no gait impairment. Voiding pale yellow urine, previously received iv lasix. Patient admitted for fluid overload CHF, tolerating lasix well. Denies nausea at this time. Family at bedside. Will continue to monitor.

## 2018-01-31 ENCOUNTER — Emergency Department (HOSPITAL_COMMUNITY)
Admission: EM | Admit: 2018-01-31 | Discharge: 2018-02-01 | Disposition: A | Discharge status: Home / Self Care | Payer: PRIVATE HEALTH INSURANCE | Attending: Emergency Medicine | Admitting: Emergency Medicine

## 2018-01-31 ENCOUNTER — Encounter (HOSPITAL_COMMUNITY): Payer: Self-pay | Admitting: Emergency Medicine

## 2018-01-31 ENCOUNTER — Other Ambulatory Visit: Payer: Self-pay

## 2018-01-31 DIAGNOSIS — Z87891 Personal history of nicotine dependence: Secondary | ICD-10-CM | POA: Insufficient documentation

## 2018-01-31 DIAGNOSIS — L0231 Cutaneous abscess of buttock: Secondary | ICD-10-CM | POA: Insufficient documentation

## 2018-01-31 DIAGNOSIS — I509 Heart failure, unspecified: Secondary | ICD-10-CM | POA: Insufficient documentation

## 2018-01-31 DIAGNOSIS — F1290 Cannabis use, unspecified, uncomplicated: Secondary | ICD-10-CM | POA: Insufficient documentation

## 2018-01-31 DIAGNOSIS — Z79899 Other long term (current) drug therapy: Secondary | ICD-10-CM | POA: Insufficient documentation

## 2018-01-31 DIAGNOSIS — I10 Essential (primary) hypertension: Secondary | ICD-10-CM

## 2018-01-31 DIAGNOSIS — K0889 Other specified disorders of teeth and supporting structures: Secondary | ICD-10-CM

## 2018-01-31 LAB — BASIC METABOLIC PANEL
Anion gap: 11 (ref 5–15)
BUN: 8 mg/dL (ref 6–20)
CHLORIDE: 101 mmol/L (ref 98–111)
CO2: 24 mmol/L (ref 22–32)
CREATININE: 0.83 mg/dL (ref 0.61–1.24)
Calcium: 9.2 mg/dL (ref 8.9–10.3)
GFR calc Af Amer: >60 mL/min (ref >60)
GFR calc non Af Amer: >60 mL/min (ref >60)
Glucose, Bld: 92 mg/dL (ref 70–99)
POTASSIUM: 3.7 mmol/L (ref 3.5–5.1)
SODIUM: 136 mmol/L (ref 135–145)

## 2018-01-31 LAB — CBC WITH DIFFERENTIAL/PLATELET
ABS IMMATURE GRANULOCYTES: 0.05 10*3/uL (ref 0.00–0.07)
Basophils Absolute: 0.1 10*3/uL (ref 0.0–0.1)
Basophils Relative: 1 %
EOS PCT: 2 %
Eosinophils Absolute: 0.2 10*3/uL (ref 0.0–0.5)
HEMATOCRIT: 42.3 % (ref 39.0–52.0)
HEMOGLOBIN: 13.3 g/dL (ref 13.0–17.0)
Immature Granulocytes: 1 %
LYMPHS ABS: 2.1 10*3/uL (ref 0.7–4.0)
LYMPHS PCT: 26 %
MCH: 26.1 pg (ref 26.0–34.0)
MCHC: 31.4 g/dL (ref 30.0–36.0)
MCV: 82.9 fL (ref 80.0–100.0)
MONO ABS: 0.8 10*3/uL (ref 0.1–1.0)
MONOS PCT: 9 %
Neutro Abs: 5.1 10*3/uL (ref 1.7–7.7)
Neutrophils Relative %: 61 %
Platelets: 256 10*3/uL (ref 150–400)
RBC: 5.1 MIL/uL (ref 4.22–5.81)
RDW: 14.2 % (ref 11.5–15.5)
WBC: 8.2 10*3/uL (ref 4.0–10.5)
nRBC: 0 % (ref 0.0–0.2)

## 2018-01-31 MED ORDER — LIDOCAINE-EPINEPHRINE (PF) 2 %-1:200000 IJ SOLN
10.0000 mL | Freq: Once | INTRAMUSCULAR | Status: AC
  Administered 2018-02-01: 10 mL
  Filled 2018-01-31: qty 20

## 2018-01-31 MED ORDER — IBUPROFEN 400 MG PO TABS
400.0000 mg | ORAL_TABLET | Freq: Once | ORAL | Status: AC | PRN
  Administered 2018-01-31: 400 mg via ORAL
  Filled 2018-01-31: qty 1

## 2018-01-31 MED ORDER — OXYCODONE-ACETAMINOPHEN 5-325 MG PO TABS
1.0000 | ORAL_TABLET | Freq: Once | ORAL | Status: AC
  Administered 2018-02-01: 1 via ORAL
  Filled 2018-01-31: qty 1

## 2018-01-31 NOTE — ED Triage Notes (Signed)
Pt reports an abscess in between his buttocks that started 3 days ago. Pt unable to sit and sleep d/t the pain. Pain 9/10. Pt denies any drainage. Pt has hx of htn. Pt had recent dental procedure on the left side of his face that is also causing pain.

## 2018-01-31 NOTE — ED Provider Notes (Signed)
[facility]ENC[facility] Provider Note   CSN: [id number] Arrival date & time: 01/31/18  1825     History   Chief Complaint Chief Complaint  Patient presents with  . Abscess    HPI William Benitez is a 47 y.o. male with a history of HTN, CHF, and OSA who presents to the emergency department with a chief complaint of abscess.  The patient endorses a 3-day history of pain and swelling to his right buttock.  Symptoms have been constant and worsening since onset.  He states the current pain is at 9 out of 10.  Pain is worse with sitting and sleeping on his back or applying pressure to the area.  No known alleviating factors.   He states that he felt his pain worsened today.  He reports that he had to teeth pulled on the lower left side earlier today.  He was given a prescription for Tylenol 3, but has been unable to get the prescription filled because after the procedure when his pain worsened the pain near his right buttock also started to worsen.  He denies a fever or chills.  No drainage from the area.  He denies rectal or perineum pain, penile or testicular pain or swelling, melena, or hematochezia.  He states he had a similar area many years ago that required an incision and drainage.  He also reports a history of chronic tachycardia for which she is followed by cardiology.  The history is provided by the patient. No language interpreter was used.    Past Medical History:  Diagnosis Date  . Arthritis   . CHF (congestive heart failure) (HCC)   . Hypertension   . Sleep apnea     Patient Active Problem List   Diagnosis Date Noted  . Acute respiratory failure with hypoxia (HCC) 05/02/2017  . Abdominal pain 05/02/2017  . Nausea, vomiting and diarrhea 05/02/2017  . CHF (congestive heart failure) (HCC)   . Sleep apnea   . Hypertension     History reviewed. No pertinent surgical history.      Home Medications    Prior to Admission medications     Medication Sig Start Date End Date Taking? Authorizing Provider  albuterol (PROVENTIL HFA;VENTOLIN HFA) 108 (90 Base) MCG/ACT inhaler Inhale 1-2 puffs into the lungs every 6 (six) hours as needed for wheezing or shortness of breath.    [provider]  amLODipine (NORVASC) 5 MG tablet Take 1 tablet (5 mg total) by mouth daily. 02/01/18 03/03/18  Norlan Rann A, PA-C  amoxicillin-clavulanate (AUGMENTIN) 875-125 MG tablet Take 1 tablet by mouth every 12 (twelve) hours for 7 days. 02/01/18 02/08/18  Lavere Stork A, PA-C  atorvastatin (LIPITOR) 40 MG tablet Take 40 mg by mouth daily.    [provider]  beclomethasone (QVAR) 40 MCG/ACT inhaler Inhale 1 puff into the lungs 2 (two) times daily.    [provider]  carvedilol (COREG) 12.5 MG tablet Take 12.5 mg by mouth 2 (two) times daily with a meal.    [provider]  fluticasone-salmeterol (ADVAIR HFA) 115-21 MCG/ACT inhaler Inhale 2 puffs into the lungs 2 (two) times daily.    [provider]  furosemide (LASIX) 40 MG tablet Take 40 mg by mouth 2 (two) times daily.    [provider]  lisinopril (PRINIVIL,ZESTRIL) 5 MG tablet Take 1 tablet (5 mg total) by mouth daily. 05/04/17   Hollice EspyKrishnan, Sendil K, MD  sildenafil (REVATIO) 20 MG tablet Take 20 mg  by mouth 3 (three) times daily. Take 3 to 5 tablets as needed one hour prior to sexual activity as needed.    [provider]    Family History Family History  Problem Relation Age of Onset  . Hypertension Mother   . Liver disease Mother   . Kidney disease Father     Social History Social History   Tobacco Use  . Smoking status: Former Games developer  . Smokeless tobacco: Never Used  Substance Use Topics  . Alcohol use: No    Frequency: Never  . Drug use: Yes    Types: Marijuana    Comment: occ     Allergies   Patient has no known allergies.   Review of Systems Review of Systems  Constitutional: Negative for appetite change,  chills and fever.  HENT: Positive for dental problem.   Respiratory: Negative for shortness of breath.   Cardiovascular: Negative for chest pain.  Gastrointestinal: Negative for abdominal pain, diarrhea and vomiting.  Genitourinary: Negative for dysuria.  Musculoskeletal: Negative for back pain.  Skin: Positive for wound. Negative for rash.  Allergic/Immunologic: Negative for immunocompromised state.  Neurological: Negative for weakness, numbness and headaches.  Psychiatric/Behavioral: Negative for confusion.     Physical Exam Updated Vital Signs BP (!) 174/91 (BP Location: Right Arm)   Pulse (!) 110   Temp 99.8 F (37.7 C) (Oral)   Resp 18   Ht 5\' 9"  (1.753 m)   Wt (!) 153.3 kg   SpO2 100%   BMI 49.91 kg/m   Physical Exam  Constitutional: He appears well-developed.  HENT:  Head: Normocephalic.  Mouth/Throat:    Teeth 17 and 18 are avulsed.   Eyes: Conjunctivae are normal.  Neck: Neck supple.  Cardiovascular: Normal rate and regular rhythm.  No murmur heard. Pulmonary/Chest: Effort normal. No stridor. No respiratory distress. He has no wheezes. He has no rales. He exhibits no tenderness.  Abdominal: Soft. He exhibits no distension.  Musculoskeletal: He exhibits tenderness. He exhibits no deformity.  There is a 4 x 4 area of induration and fluctuance to the right buttock. No anal involvement. No red streaking. No overlying erythema and minimal warm.   Neurological: He is alert.  Skin: Skin is warm and dry.  Psychiatric: His behavior is normal.  Nursing note and vitals reviewed.  ED Treatments / Results  Labs (all labs ordered are listed, but only abnormal results are displayed) Labs Reviewed  AEROBIC CULTURE (SUPERFICIAL SPECIMEN)  CBC WITH DIFFERENTIAL/PLATELET  BASIC METABOLIC PANEL    EKG None  Radiology No results found.  Procedures .Marland KitchenIncision and Drainage Date/Time: 02/01/2018 8:14 AM Performed by: Barkley Boards, PA-C Authorized by: Barkley Boards, PA-C   Consent:    Consent obtained:  Verbal   Consent given by:  Patient   Risks discussed:  Bleeding, incomplete drainage, pain and damage to other organs   Alternatives discussed:  No treatment Universal protocol:    Procedure explained and questions answered to patient or proxy's satisfaction: yes     Relevant documents present and verified: yes     Test results available and properly labeled: yes     Imaging studies available: yes     Required blood products, implants, devices, and special equipment available: yes     Site/side marked: yes     Immediately prior to procedure a time out was called: yes     Patient identity confirmed:  Verbally with patient Location:    Type:  Abscess Pre-procedure details:  Skin preparation:  Betadine Anesthesia (see MAR for exact dosages):    Anesthesia method:  Local infiltration   Local anesthetic:  Lidocaine 1% WITH epi Procedure type:    Complexity:  Complex Procedure details:    Incision types:  Single straight   Incision depth:  Subcutaneous   Scalpel blade:  11   Wound management:  Probed and deloculated, irrigated with saline and extensive cleaning   Drainage:  Purulent   Drainage amount:  Moderate   Packing materials:  1/4 in gauze Post-procedure details:    Patient tolerance of procedure:  Tolerated well, no immediate complications   (including critical care time)  Medications Ordered in ED Medications  ibuprofen (ADVIL,MOTRIN) tablet 400 mg (400 mg Oral Given 01/31/18 1900)  lidocaine-EPINEPHrine (XYLOCAINE W/EPI) 2 %-1:200000 (PF) injection 10 mL (10 mLs Infiltration Given 02/01/18 0052)  oxyCODONE-acetaminophen (PERCOCET/ROXICET) 5-325 MG per tablet 1 tablet (1 tablet Oral Given 02/01/18 0020)  amLODipine (NORVASC) tablet 5 mg (5 mg Oral Given 02/01/18 0056)     Initial Impression / Assessment and Plan / ED Course  I have reviewed the triage vital signs and the nursing notes.  Pertinent labs & imaging  results that were available during my care of the patient were reviewed by me and considered in my medical decision making (see chart for details).     47 year old male with a history of HTN, CHF, and OSA presenting with a superficial cutaneous abscess to the right buttock.  No constitutional symptoms.  He is tachycardic on arrival, but states this is chronic.  Afebrile in the ED.  Labs are reassuring without leukocytosis.  On exam, there is a superficial cutaneous abscess to the right buttock.  Incision and drainage performed with moderate amount of purulent bloody discharge.  The wound was packed with quarter inch iodoform gauze.  Given the location of the wound, will cover the patient with Augmentin.  I encouraged the patient to have a wound recheck in 2 to 3 days as I am concerned that the patient may contaminate the wound with a loose stool based on loose stool present around his anus on exam.  Sterile dressing was placed prior to discharge.  Patient was also hypertensive at 193/136 on arrival.  This could be due in part to pain.  Percocet given for pain control in the ED.  We will start the patient on a low dose of amlodipine as his history indicates that he was previously diagnosed with hypertension, but is not currently taking any medications.  I encouraged him to have his blood pressure rechecked when he follows up for a wound check.  Home wound care instructions have been provided.  He has been given referrals to primary care, but is also been advised to return to the ED for wound recheck or if symptoms worsen.  Strict return precautions given.  He is hemodynamically stable and in no acute distress.  He is safe for discharge home with outpatient follow-up at this time.  Final Clinical Impressions(s) / ED Diagnoses   Final diagnoses:  Abscess of buttock, right  Dentalgia  Hypertension, unspecified type    ED Discharge Orders         Ordered    amoxicillin-clavulanate (AUGMENTIN) 875-125 MG  tablet  Every 12 hours     02/01/18 0046    amLODipine (NORVASC) 5 MG tablet  Daily     02/01/18 0046           Frederik Pear A, PA-C 02/01/18 2244  Shaune Pollack, MD 02/02/18 [phone]

## 2018-02-01 MED ORDER — AMLODIPINE BESYLATE 5 MG PO TABS
5.0000 mg | ORAL_TABLET | Freq: Once | ORAL | Status: AC
  Administered 2018-02-01: 5 mg via ORAL
  Filled 2018-02-01: qty 1

## 2018-02-01 MED ORDER — AMOXICILLIN-POT CLAVULANATE 875-125 MG PO TABS: 1.0000 | ORAL_TABLET | Freq: Two times a day (BID) | ORAL | 0 refills | Status: AC

## 2018-02-01 MED ORDER — AMLODIPINE BESYLATE 5 MG PO TABS: 5.0000 mg | ORAL_TABLET | Freq: Every day | ORAL | 0 refills | Status: DC

## 2018-02-01 NOTE — ED Notes (Signed)
PT states understanding of care given, follow up care, and medication prescribed. PT ambulated from ED to car with a steady gait. 

## 2018-02-01 NOTE — Discharge Instructions (Signed)
Thank you for allowing me to care for you today in the Emergency Department.   You need to follow-up to have a wound recheck in 2 to 3 days.  You can call the number on your discharge paperwork to get established with a primary care provider, return to the ER, or go to urgent care.    To care for your wound at home, clean the area at least once daily with warm water and soap.  Pat the area dry then apply a clean gauze dressing.  Make sure to change this dressing anytime that it gets wet or soiled.  Make sure to change it at least 1 time per day.   Take 1 tablet of Augmentin 2 times daily for the next week.  You can treat your pain at home with ibuprofen or Tylenol or take 1 of the Tylenol 3's that you were given by your dentist for severe pain.  You should not work or drive while taking the Tylenol 3. Do not take more than 4000 mg of Tylenol from all sources in a 24-hour period.  Your blood pressure was high today.  This may be due in part to pain.  Please have them recheck your blood pressure when you follow-up for recheck in 2 to 3 days.  Until then, take 1 tablet of amlodipine daily.  Your first dose was given tonight in the emergency department.  Return to the emergency department if you develop fevers, chills, significantly worsening pain where your wound is, particularly after you have been taking antibiotics for 2 to 3 days, if you develop rectal pain, or other new, concerning symptoms.

## 2018-02-02 ENCOUNTER — Emergency Department (HOSPITAL_COMMUNITY)
Admission: EM | Admit: 2018-02-02 | Discharge: 2018-02-02 | Disposition: A | Discharge status: Home / Self Care | Payer: PRIVATE HEALTH INSURANCE | Attending: Emergency Medicine | Admitting: Emergency Medicine

## 2018-02-02 ENCOUNTER — Encounter (HOSPITAL_COMMUNITY): Payer: Self-pay | Admitting: Emergency Medicine

## 2018-02-02 ENCOUNTER — Other Ambulatory Visit: Payer: Self-pay

## 2018-02-02 DIAGNOSIS — I509 Heart failure, unspecified: Secondary | ICD-10-CM | POA: Insufficient documentation

## 2018-02-02 DIAGNOSIS — Z79899 Other long term (current) drug therapy: Secondary | ICD-10-CM | POA: Insufficient documentation

## 2018-02-02 DIAGNOSIS — Z5189 Encounter for other specified aftercare: Secondary | ICD-10-CM

## 2018-02-02 DIAGNOSIS — I110 Hypertensive heart disease with heart failure: Secondary | ICD-10-CM | POA: Insufficient documentation

## 2018-02-02 DIAGNOSIS — Z87891 Personal history of nicotine dependence: Secondary | ICD-10-CM | POA: Insufficient documentation

## 2018-02-02 DIAGNOSIS — Z4800 Encounter for change or removal of nonsurgical wound dressing: Secondary | ICD-10-CM | POA: Insufficient documentation

## 2018-02-02 NOTE — Discharge Instructions (Signed)
Continue your antibiotics as directed. Return to ED for worsening symptoms, fevers, increased swelling or drainage from the area.

## 2018-02-02 NOTE — ED Triage Notes (Signed)
Pt has abscess drained 12/10, states he had bloody drainage today and wants it re checked. Denies fevers

## 2018-02-02 NOTE — ED Provider Notes (Signed)
[facility] EMERGENCY DEPARTMENT Provider Note   CSN: [id number] Arrival date & time: 02/02/18  1632     History   Chief Complaint Chief Complaint  Patient presents with  . Abscess    HPI William Benitez is a 47 y.o. male with a past medical history of hypertension presents to ED for recheck of abscess that was drained approximately 1.5 days ago.  States that the area was packed however, he does not have anyone that can reach the area to change the packing or recheck it.  States that he was told to come to the ED for a recheck.  He has been taking his antibiotics as prescribed.  He denies any fevers, chills, other complaints.  HPI  Past Medical History:  Diagnosis Date  . Arthritis   . CHF (congestive heart failure) (HCC)   . Hypertension   . Sleep apnea     Patient Active Problem List   Diagnosis Date Noted  . Acute respiratory failure with hypoxia (HCC) 05/02/2017  . Abdominal pain 05/02/2017  . Nausea, vomiting and diarrhea 05/02/2017  . CHF (congestive heart failure) (HCC)   . Sleep apnea   . Hypertension     History reviewed. No pertinent surgical history.      Home Medications    Prior to Admission medications   Medication Sig Start Date End Date Taking? Authorizing Provider  albuterol (PROVENTIL HFA;VENTOLIN HFA) 108 (90 Base) MCG/ACT inhaler Inhale 1-2 puffs into the lungs every 6 (six) hours as needed for wheezing or shortness of breath.    [provider]  amLODipine (NORVASC) 5 MG tablet Take 1 tablet (5 mg total) by mouth daily. 02/01/18 03/03/18  McDonald, Mia A, PA-C  amoxicillin-clavulanate (AUGMENTIN) 875-125 MG tablet Take 1 tablet by mouth every 12 (twelve) hours for 7 days. 02/01/18 02/08/18  McDonald, Mia A, PA-C  atorvastatin (LIPITOR) 40 MG tablet Take 40 mg by mouth daily.    [provider]  beclomethasone (QVAR) 40 MCG/ACT inhaler Inhale 1 puff into the lungs 2 (two) times daily.    [provider]    carvedilol (COREG) 12.5 MG tablet Take 12.5 mg by mouth 2 (two) times daily with a meal.    [provider]  fluticasone-salmeterol (ADVAIR HFA) 115-21 MCG/ACT inhaler Inhale 2 puffs into the lungs 2 (two) times daily.    [provider]  furosemide (LASIX) 40 MG tablet Take 40 mg by mouth 2 (two) times daily.    [provider]  lisinopril (PRINIVIL,ZESTRIL) 5 MG tablet Take 1 tablet (5 mg total) by mouth daily. 05/04/17   Hollice Espy, MD  sildenafil (REVATIO) 20 MG tablet Take 20 mg by mouth 3 (three) times daily. Take 3 to 5 tablets as needed one hour prior to sexual activity as needed.    [provider]    Family History Family History  Problem Relation Age of Onset  . Hypertension Mother   . Liver disease Mother   . Kidney disease Father     Social History Social History   Tobacco Use  . Smoking status: Former Games developer  . Smokeless tobacco: Never Used  Substance Use Topics  . Alcohol use: No    Frequency: Never  . Drug use: Yes    Types: Marijuana    Comment: occ     Allergies   Patient has no known allergies.   Review of Systems Review of Systems  Constitutional: Negative for chills and fever.  Gastrointestinal: Negative  for vomiting.  Skin: Positive for wound.     Physical Exam Updated Vital Signs BP (!) 142/98 (BP Location: Left Wrist)   Pulse (!) 116   Temp 99.9 F (37.7 C) (Oral)   Resp 16   Ht 5\' 9"  (1.753 m)   Wt (!) 153.3 kg   SpO2 99%   BMI 49.91 kg/m   Physical Exam Vitals signs and nursing note reviewed. Exam conducted with a chaperone present.  Constitutional:      General: He is not in acute distress.    Appearance: He is well-developed. He is not diaphoretic.  HENT:     Head: Normocephalic and atraumatic.  Eyes:     General: No scleral icterus.    Conjunctiva/sclera: Conjunctivae normal.  Neck:     Musculoskeletal: Normal range of motion.  Pulmonary:     Effort: Pulmonary effort is  normal. No respiratory distress.  Skin:    Findings: No rash.     Comments: 1 cm horizontal incision noted on the left buttock.  Packing in place.  No surrounding erythema, fluctuance or induration noted.  Neurological:     Mental Status: He is alert.      ED Treatments / Results  Labs (all labs ordered are listed, but only abnormal results are displayed) Labs Reviewed - No data to display  EKG None  Radiology No results found.  Procedures Procedures (including critical care time)  Medications Ordered in ED Medications - No data to display   Initial Impression / Assessment and Plan / ED Course  I have reviewed the triage vital signs and the nursing notes.  Pertinent labs & imaging results that were available during my care of the patient were reviewed by me and considered in my medical decision making (see chart for details).     47 year old male presents to ED for recheck of abscess on left buttock that was drained 1.5 days ago.  States that he was given supplies for changing the gauze but was unable to do so due to the location of the wound.  He has been taking his antibiotics as prescribed.  Denies any fevers.  On exam there appears to be a well-healing incision and drainage area.  Packing was removed with some blood noted on the packing and minimal purulent drainage.  Area was cleaned and redressed.  I do not feel that repacking is necessary at this time.  Encourage patient to continue his antibiotics.  Patient tachycardic here but chart review shows that he has been tachycardic in the past and states that "my heart rate is always high."  Will advise him to return to ED for any severe worsening symptoms.  Patient is hemodynamically stable, in NAD, and able to ambulate in the ED. Evaluation does not show pathology that would require ongoing emergent intervention or inpatient treatment. I explained the diagnosis to the patient. Pain has been managed and has no complaints prior  to discharge. Patient is comfortable with above plan and is stable for discharge at this time. All questions were answered prior to disposition. Strict return precautions for returning to the ED were discussed. Encouraged follow up with PCP.    Portions of this note were generated with Scientist, clinical (histocompatibility and immunogenetics). Dictation errors may occur despite best attempts at proofreading.   Final Clinical Impressions(s) / ED Diagnoses   Final diagnoses:  Encounter for wound re-check    ED Discharge Orders    None       Dietrich Pates, PA-C 02/02/18  1719    Terrilee FilesButler, Michael C, MD 02/03/18 1051

## 2018-02-02 NOTE — ED Notes (Signed)
Patient verbalizes understanding of discharge instructions. Opportunity for questioning and answers were provided. Armband removed by staff, pt discharged from ED.  

## 2018-02-03 LAB — AEROBIC CULTURE W GRAM STAIN (SUPERFICIAL SPECIMEN): Culture: NORMAL

## 2018-02-03 LAB — AEROBIC CULTURE  (SUPERFICIAL SPECIMEN)

## 2018-02-08 ENCOUNTER — Ambulatory Visit: Payer: PRIVATE HEALTH INSURANCE | Admitting: Family

## 2018-02-20 ENCOUNTER — Other Ambulatory Visit: Payer: Self-pay | Admitting: Family

## 2018-02-20 NOTE — Telephone Encounter (Signed)
Copied from CRM [id number]. Topic: Quick Communication - Rx Refill/Question >> Feb 20, 2018  3:55 PM Jaquita RectorDavis, Karen A wrote: Medication: furosemide (LASIX) 40 MG tablet, atorvastatin (LIPITOR) 40 MG tablet,  carvedilol (COREG) 12.5 MG tablet    Has the patient contacted their pharmacy? Yes.   (Agent: If no, request that the patient contact the pharmacy for the refill.) (Agent: If yes, when and what did the pharmacy advise?)  Preferred Pharmacy (with phone number or street name): [id number]- [place], [place] - [address] [phone] (Phone) [phone] (Fax)    Agent: Please be advised that RX refills may take up to 3 business days. We ask that you follow-up with your pharmacy.

## 2018-02-21 NOTE — Telephone Encounter (Signed)
This patient has not established care with [facility]. She has received these medications from Cardiology in the past. Refusing refills due to non established patient. TC to patient and number on file unable to leave a VM.

## 2018-02-21 NOTE — Telephone Encounter (Signed)
Charting error with previous note. Patient has not established care [facility].  Refill request refused at this time.

## 2018-02-27 ENCOUNTER — Telehealth: Payer: Self-pay | Admitting: Internal Medicine

## 2018-02-27 NOTE — Telephone Encounter (Signed)
Copied from CRM [id number]. Topic: General - Other >> Feb 27, 2018  3:12 PM Lynne Logan D wrote: Reason for CRM: Pt would like to establish care with Dr. Lawerance Bach as his PCP. Stated when he made New Patient Appt he thought he was establishing with her and not Ria Clock. He was recommended by family member Barton Fanny who he stated is a current pt of Dr. Lawerance Bach. Please reach out to pt and let him know if he can be approved or not. CB#[phone]  Pt had asked for med refills as well. Explained that without being established/seen they would not be refilled. Pt understood

## 2018-02-28 NOTE — Telephone Encounter (Signed)
I have called both of patients phone numbers to try to schedule.  Both phones would ring with no vm.

## 2018-02-28 NOTE — Telephone Encounter (Signed)
Pt has a new pt appt with dr burns on 04-24-2018

## 2018-02-28 NOTE — Telephone Encounter (Signed)
Yes I will accept him 

## 2018-03-07 ENCOUNTER — Ambulatory Visit: Payer: PRIVATE HEALTH INSURANCE | Admitting: Family Medicine

## 2018-04-19 ENCOUNTER — Other Ambulatory Visit: Payer: Self-pay

## 2018-04-19 ENCOUNTER — Emergency Department (HOSPITAL_COMMUNITY)
Admission: EM | Admit: 2018-04-19 | Discharge: 2018-04-20 | Disposition: A | Discharge status: Home / Self Care | Payer: 59 | Attending: Emergency Medicine | Admitting: Emergency Medicine

## 2018-04-19 ENCOUNTER — Emergency Department (HOSPITAL_COMMUNITY): Payer: 59

## 2018-04-19 ENCOUNTER — Encounter (HOSPITAL_COMMUNITY): Payer: Self-pay

## 2018-04-19 DIAGNOSIS — L739 Follicular disorder, unspecified: Secondary | ICD-10-CM | POA: Insufficient documentation

## 2018-04-19 DIAGNOSIS — Z87891 Personal history of nicotine dependence: Secondary | ICD-10-CM | POA: Diagnosis not present

## 2018-04-19 DIAGNOSIS — R1012 Left upper quadrant pain: Secondary | ICD-10-CM | POA: Insufficient documentation

## 2018-04-19 DIAGNOSIS — I509 Heart failure, unspecified: Secondary | ICD-10-CM | POA: Insufficient documentation

## 2018-04-19 DIAGNOSIS — I110 Hypertensive heart disease with heart failure: Secondary | ICD-10-CM | POA: Insufficient documentation

## 2018-04-19 DIAGNOSIS — R0602 Shortness of breath: Secondary | ICD-10-CM

## 2018-04-19 NOTE — ED Provider Notes (Addendum)
[facility] EMERGENCY DEPARTMENT Provider Note   CSN: [id number] Arrival date & time: 04/19/18  1903    History   Chief Complaint Chief Complaint  Patient presents with  . Shortness of Breath    HPI William Benitez is a 48 y.o. male.     Patient with past medical history remarkable for CHF, hypertension, presents to the emergency department with a chief complaint of shortness of breath.  He states that the symptoms have been gradually worsening over the past several days particularly since last night.  He has been out of his regular medications.  He reports that he has had some increased swelling of his bilateral lower extremities.  He is also concerned about having had a bowel movement and feeling a pop in his abdomen and having pain in the lower left side of his chest and upper left abdomen.  He states that this is worsened when he drinks water, and feels like he has more shortness of breath after doing so.  His symptoms are worsened with lying down.  He denies any productive cough or fever.  Denies any other associated symptoms.  The history is provided by the patient. No language interpreter was used.    Past Medical History:  Diagnosis Date  . Arthritis   . CHF (congestive heart failure) (HCC)   . Hypertension   . Sleep apnea     Patient Active Problem List   Diagnosis Date Noted  . Acute respiratory failure with hypoxia (HCC) 05/02/2017  . Abdominal pain 05/02/2017  . Nausea, vomiting and diarrhea 05/02/2017  . CHF (congestive heart failure) (HCC)   . Sleep apnea   . Hypertension     History reviewed. No pertinent surgical history.      Home Medications    Prior to Admission medications   Medication Sig Start Date End Date Taking? Authorizing Provider  albuterol (PROVENTIL HFA;VENTOLIN HFA) 108 (90 Base) MCG/ACT inhaler Inhale 1-2 puffs into the lungs every 6 (six) hours as needed for wheezing or shortness of breath.    [provider]   amLODipine (NORVASC) 5 MG tablet Take 1 tablet (5 mg total) by mouth daily. 02/01/18 03/03/18  McDonald, Mia A, PA-C  atorvastatin (LIPITOR) 40 MG tablet Take 40 mg by mouth daily.    [provider]  beclomethasone (QVAR) 40 MCG/ACT inhaler Inhale 1 puff into the lungs 2 (two) times daily.    [provider]  carvedilol (COREG) 12.5 MG tablet Take 12.5 mg by mouth 2 (two) times daily with a meal.    [provider]  fluticasone-salmeterol (ADVAIR HFA) 115-21 MCG/ACT inhaler Inhale 2 puffs into the lungs 2 (two) times daily.    [provider]  furosemide (LASIX) 40 MG tablet Take 40 mg by mouth 2 (two) times daily.    [provider]  lisinopril (PRINIVIL,ZESTRIL) 5 MG tablet Take 1 tablet (5 mg total) by mouth daily. 05/04/17   Hollice Espy, MD  sildenafil (REVATIO) 20 MG tablet Take 20 mg by mouth 3 (three) times daily. Take 3 to 5 tablets as needed one hour prior to sexual activity as needed.    [provider]    Family History Family History  Problem Relation Age of Onset  . Hypertension Mother   . Liver disease Mother   . Kidney disease Father     Social History Social History   Tobacco Use  . Smoking status: Former Games developer  . Smokeless tobacco: Never Used  Substance Use Topics  . Alcohol use: No    Frequency: Never  . Drug use: Yes    Types: Marijuana    Comment: occ     Allergies   Patient has no known allergies.   Review of Systems Review of Systems  All other systems reviewed and are negative.    Physical Exam Updated Vital Signs BP (!) 144/97 (BP Location: Right Arm)   Pulse 93   Temp 99.5 F (37.5 C) (Oral)   Resp 20   Ht 5\' 9"  (1.753 m)   Wt (!) 158.8 kg   SpO2 99%   BMI 51.69 kg/m   Physical Exam Vitals signs and nursing note reviewed.  Constitutional:      Appearance: He is well-developed.  HENT:     Head: Normocephalic and atraumatic.  Eyes:     General: No scleral icterus.        Right eye: No discharge.        Left eye: No discharge.     Conjunctiva/sclera: Conjunctivae normal.     Pupils: Pupils are equal, round, and reactive to light.  Neck:     Musculoskeletal: Normal range of motion and neck supple.     Vascular: No JVD.  Cardiovascular:     Rate and Rhythm: Normal rate and regular rhythm.     Heart sounds: Normal heart sounds. No murmur. No friction rub. No gallop.   Pulmonary:     Effort: Pulmonary effort is normal. No respiratory distress.     Breath sounds: Normal breath sounds. No wheezing or rales.  Chest:     Chest wall: No tenderness.  Abdominal:     General: There is no distension.     Palpations: Abdomen is soft. There is no mass.     Tenderness: There is no abdominal tenderness. There is no guarding or rebound.  Musculoskeletal: Normal range of motion.        General: No tenderness.     Right lower leg: Edema present.     Left lower leg: Edema present.  Skin:    General: Skin is warm and dry.  Neurological:     Mental Status: He is alert and oriented to person, place, and time.  Psychiatric:        Behavior: Behavior normal.        Thought Content: Thought content normal.        Judgment: Judgment normal.      ED Treatments / Results  Labs (all labs ordered are listed, but only abnormal results are displayed) Labs Reviewed  CBC WITH DIFFERENTIAL/PLATELET  COMPREHENSIVE METABOLIC PANEL  BRAIN NATRIURETIC PEPTIDE  I-STAT TROPONIN, ED    EKG None  Radiology Dg Chest 2 View  Result Date: 04/19/2018 CLINICAL DATA:  Shortness of breath and left flank pain since last night. Nonsmoker. EXAM: CHEST - 2 VIEW COMPARISON:  05/01/2017 FINDINGS: The heart size and mediastinal contours are within normal limits. Both lungs are clear. The visualized skeletal structures are unremarkable. IMPRESSION: No active cardiopulmonary disease. Electronically Signed   By: Burman Nieves M.D.   On: 04/19/2018 19:52    Procedures Procedures  (including critical care time)  Medications Ordered in ED Medications - No data to display   Initial Impression / Assessment and Plan / ED Course  I have reviewed the triage vital signs and the nursing notes.  Pertinent labs & imaging results that were available during my care of the patient were reviewed by me and considered in  my medical decision making (see chart for details).        Patient presents with left flank pain.  No chest pain. Doubt ACS.  He states that he was having a bowel movement the other day, and felt like something popped beneath his left rib.  He also complains of soreness on the skin.  Additionally, patient states that he has been out of his regular medications, and has a history of CHF.  He states that he has noticed some mildly increasing lower extremity edema and some shortness of breath.  We will check labs, will reassess.  Laboratory work-up is reassuring.  Patient does have a small area of cellulitis on his left flank, at this time, does not appear characteristic of shingles.  Will cover with Doxy.  Imaging is reassuring.  No acute findings to explain the patient's symptoms.  I will restart the patient on his regular medications and also treat the cellulitis.  Specific return precautions have been given.  Patient understands agrees with the plan.  He is stable and ready for discharge.  Patient ambulates maintaining >90% pulse ox observed by me and EMT Jane.  Final Clinical Impressions(s) / ED Diagnoses   Final diagnoses:  Folliculitis  SOB (shortness of breath)    ED Discharge Orders         Ordered    amLODipine (NORVASC) 5 MG tablet  Daily     04/20/18 0449    atorvastatin (LIPITOR) 40 MG tablet  Daily     04/20/18 0449    carvedilol (COREG) 12.5 MG tablet  2 times daily with meals     04/20/18 0449    furosemide (LASIX) 40 MG tablet  2 times daily     04/20/18 0449    lisinopril (PRINIVIL,ZESTRIL) 5 MG tablet  Daily     04/20/18 0449     doxycycline (VIBRAMYCIN) 100 MG capsule  2 times daily     04/20/18 0449           Roxy Horseman, PA-C 04/20/18 0513    Roxy Horseman, PA-C 04/20/18 0349    Glynn Octave, MD 04/20/18 [phone]

## 2018-04-19 NOTE — ED Notes (Signed)
The pt has had anterior and posterior chest pain since yesterday  He felt something pop in his lt posterior rib area  And he al;so has rt antrior rib pain  Also.  Some sob  No known injury.

## 2018-04-19 NOTE — ED Triage Notes (Signed)
Pt arrives with SOB for 3-5 days, also states that yesterday while he was having a BM, he felt something "pop" in his right lower abdomen, no hx of hernias. Pt states he feels excessively bloated after eating. Pt in NAD during triage, eating [employer].

## 2018-04-20 ENCOUNTER — Emergency Department (HOSPITAL_COMMUNITY): Payer: 59

## 2018-04-20 LAB — CBC WITH DIFFERENTIAL/PLATELET
Abs Immature Granulocytes: 0.03 10*3/uL (ref 0.00–0.07)
BASOS PCT: 1 %
Basophils Absolute: 0 10*3/uL (ref 0.0–0.1)
EOS PCT: 2 %
Eosinophils Absolute: 0.2 10*3/uL (ref 0.0–0.5)
HCT: 41 % (ref 39.0–52.0)
Hemoglobin: 12.9 g/dL — ABNORMAL LOW (ref 13.0–17.0)
Immature Granulocytes: 0 %
Lymphocytes Relative: 26 %
Lymphs Abs: 2.3 10*3/uL (ref 0.7–4.0)
MCH: 26.8 pg (ref 26.0–34.0)
MCHC: 31.5 g/dL (ref 30.0–36.0)
MCV: 85.1 fL (ref 80.0–100.0)
Monocytes Absolute: 0.8 10*3/uL (ref 0.1–1.0)
Monocytes Relative: 9 %
Neutro Abs: 5.5 10*3/uL (ref 1.7–7.7)
Neutrophils Relative %: 62 %
PLATELETS: 237 10*3/uL (ref 150–400)
RBC: 4.82 MIL/uL (ref 4.22–5.81)
RDW: 15 % (ref 11.5–15.5)
WBC: 8.8 10*3/uL (ref 4.0–10.5)
nRBC: 0 % (ref 0.0–0.2)

## 2018-04-20 LAB — COMPREHENSIVE METABOLIC PANEL
ALT: 21 U/L (ref 0–44)
AST: 17 U/L (ref 15–41)
Albumin: 3.5 g/dL (ref 3.5–5.0)
Alkaline Phosphatase: 31 U/L — ABNORMAL LOW (ref 38–126)
Anion gap: 11 (ref 5–15)
BUN: 5 mg/dL — ABNORMAL LOW (ref 6–20)
CHLORIDE: 100 mmol/L (ref 98–111)
CO2: 25 mmol/L (ref 22–32)
CREATININE: 0.87 mg/dL (ref 0.61–1.24)
Calcium: 9 mg/dL (ref 8.9–10.3)
GFR calc Af Amer: >60 mL/min (ref >60)
GFR calc non Af Amer: >60 mL/min (ref >60)
Glucose, Bld: 107 mg/dL — ABNORMAL HIGH (ref 70–99)
Potassium: 3.6 mmol/L (ref 3.5–5.1)
Sodium: 136 mmol/L (ref 135–145)
Total Bilirubin: 0.6 mg/dL (ref 0.3–1.2)
Total Protein: 7 g/dL (ref 6.5–8.1)

## 2018-04-20 LAB — BRAIN NATRIURETIC PEPTIDE: B Natriuretic Peptide: 60.9 pg/mL (ref 0.0–100.0)

## 2018-04-20 LAB — I-STAT TROPONIN, ED: Troponin i, poc: 0.01 ng/mL (ref 0.00–0.08)

## 2018-04-20 MED ORDER — CARVEDILOL 12.5 MG PO TABS: 12.5000 mg | ORAL_TABLET | Freq: Two times a day (BID) | ORAL | 0 refills | Status: DC

## 2018-04-20 MED ORDER — DOXYCYCLINE HYCLATE 100 MG PO CAPS: 100.0000 mg | ORAL_CAPSULE | Freq: Two times a day (BID) | ORAL | 0 refills | Status: DC

## 2018-04-20 MED ORDER — IOHEXOL 300 MG/ML  SOLN
125.0000 mL | Freq: Once | INTRAMUSCULAR | Status: AC | PRN
  Administered 2018-04-20: 125 mL via INTRAVENOUS

## 2018-04-20 MED ORDER — IOPAMIDOL (ISOVUE-370) INJECTION 76%
100.0000 mL | Freq: Once | INTRAVENOUS | Status: AC | PRN
  Administered 2018-04-20: 100 mL via INTRAVENOUS

## 2018-04-20 MED ORDER — AMLODIPINE BESYLATE 5 MG PO TABS: 5.0000 mg | ORAL_TABLET | Freq: Every day | ORAL | 0 refills | Status: DC

## 2018-04-20 MED ORDER — LORAZEPAM 2 MG/ML IJ SOLN
1.0000 mg | Freq: Once | INTRAMUSCULAR | Status: AC
  Administered 2018-04-20: 1 mg via INTRAVENOUS
  Filled 2018-04-20: qty 1

## 2018-04-20 MED ORDER — ATORVASTATIN CALCIUM 40 MG PO TABS: 40.0000 mg | ORAL_TABLET | Freq: Every day | ORAL | 0 refills | Status: DC

## 2018-04-20 MED ORDER — FUROSEMIDE 40 MG PO TABS: 40.0000 mg | ORAL_TABLET | Freq: Two times a day (BID) | ORAL | 0 refills | Status: DC

## 2018-04-20 MED ORDER — LISINOPRIL 5 MG PO TABS: 5.0000 mg | ORAL_TABLET | Freq: Every day | ORAL | 1 refills | Status: DC

## 2018-04-20 MED ORDER — IOPAMIDOL (ISOVUE-370) INJECTION 76%
INTRAVENOUS | Status: AC
  Filled 2018-04-20: qty 100

## 2018-04-20 NOTE — ED Notes (Signed)
  Patient refused transport to CT.  Patient states he needs more medicine because the other stuff didn't work.

## 2018-04-20 NOTE — ED Notes (Signed)
Pt went to c-t but told staff that he had difficulty in tight p[laces  Pt sent back here

## 2018-04-20 NOTE — ED Notes (Signed)
To c-t for the 3rd time  Ativan 1mg  given in scanner

## 2018-04-23 DIAGNOSIS — R739 Hyperglycemia, unspecified: Secondary | ICD-10-CM | POA: Insufficient documentation

## 2018-04-23 DIAGNOSIS — E785 Hyperlipidemia, unspecified: Secondary | ICD-10-CM | POA: Insufficient documentation

## 2018-04-23 DIAGNOSIS — J45909 Unspecified asthma, uncomplicated: Secondary | ICD-10-CM | POA: Insufficient documentation

## 2018-04-23 NOTE — Progress Notes (Signed)
Subjective:    Patient ID: William Benitez, male    DOB: 04-Dec-1970, 48 y.o.   MRN: [id number]  HPI  He is here to establish with a new pcp.   The patient is here for follow up.  Mild LVH, ? HF, Hypertension, SOB: He is taking his coreg twice daily. He is not compliant with a low sodium diet. His diet is poor. He is SOB and has chest pain, palps and edema.  He is not exercising regularly.  He does not monitor his blood pressure at home.    Hyperlipidemia: He is taking his medication daily. He is not compliant with a low fat/cholesterol diet. He is not exercising regularly. He denies myalgias.   Hyperglycemia:  He is not compliant with a low sugar/carb diet.  He is not exercising regularly.    Asthma: he takes his maintenance inhaler daily and rarely uses the albuterol inhaler.  He states the asthma is related to his heart  - he denies asthma as a child.    OSA:  He has severe OSA.  He is not using a cpap now - he does not like his mask and needs a new machine.  His last sleep study was over 10 years ago.    Obesity:  He is not eating healthy. He is not exercising.  He has SOB and can not do much.    Cramping in toes, feet and legs:  He does not drink much water a day.  He is taking lasix twice a day.  He does not drink water - just sweet tea and soda.    Folliculitis/cellulitis left flank:  He was recently seen in the ED for folliculitis/cellulitis and was placed on doxycyline.    Has a history of gerd and stomach spasms.  He was on protonix in the past and needs to restart it.  He has had epigastric pain and black stool on occasion.    Medications and allergies reviewed with patient and updated if appropriate.  Patient Active Problem List   Diagnosis Date Noted  . Asthma 04/23/2018  . Hyperglycemia 04/23/2018  . Hyperlipidemia 04/23/2018  . Acute respiratory failure with hypoxia (HCC) 05/02/2017  . Abdominal pain 05/02/2017  . CHF (congestive heart failure) (HCC)   . Sleep apnea    . Hypertension     Current Outpatient Medications on File Prior to Visit  Medication Sig Dispense Refill  . albuterol (PROVENTIL HFA;VENTOLIN HFA) 108 (90 Base) MCG/ACT inhaler Inhale 1-2 puffs into the lungs every 6 (six) hours as needed for wheezing or shortness of breath.    Marland Kitchen atorvastatin (LIPITOR) 40 MG tablet Take 1 tablet (40 mg total) by mouth daily. 30 tablet 0  . carvedilol (COREG) 12.5 MG tablet Take 1 tablet (12.5 mg total) by mouth 2 (two) times daily with a meal. 60 tablet 0  . ciclesonide (ALVESCO) 160 MCG/ACT inhaler Inhale 1 puff into the lungs 2 (two) times daily.    . furosemide (LASIX) 40 MG tablet Take 1 tablet (40 mg total) by mouth 2 (two) times daily. 60 tablet 0  . amLODipine (NORVASC) 5 MG tablet Take 1 tablet (5 mg total) by mouth daily for 30 days. (Patient not taking: Reported on 04/24/2018) 30 tablet 0  . lisinopril (PRINIVIL,ZESTRIL) 5 MG tablet Take 1 tablet (5 mg total) by mouth daily. (Patient not taking: Reported on 04/24/2018) 30 tablet 1   No current facility-administered medications on file prior to visit.     Past  Medical History:  Diagnosis Date  . Arthritis   . Asthma   . CHF (congestive heart failure) (HCC)   . Hypertension   . Sleep apnea     No past surgical history on file.  Social History   Socioeconomic History  . Marital status: Married    Spouse name: Not on file  . Number of children: Not on file  . Years of education: Not on file  . Highest education level: Not on file  Occupational History  . Not on file  Social Needs  . Financial resource strain: Not on file  . Food insecurity:    Worry: Not on file    Inability: Not on file  . Transportation needs:    Medical: Not on file    Non-medical: Not on file  Tobacco Use  . Smoking status: Former Games developermoker  . Smokeless tobacco: Never Used  Substance and Sexual Activity  . Alcohol use: No    Frequency: Never  . Drug use: Yes    Types: Marijuana    Comment: occ  . Sexual  activity: Not on file  Lifestyle  . Physical activity:    Days per week: Not on file    Minutes per session: Not on file  . Stress: Not on file  Relationships  . Social connections:    Talks on phone: Not on file    Gets together: Not on file    Attends religious service: Not on file    Active member of club or organization: Not on file    Attends meetings of clubs or organizations: Not on file    Relationship status: Not on file  Other Topics Concern  . Not on file  Social History Narrative  . Not on file    Family History  Problem Relation Age of Onset  . Hypertension Mother   . Liver disease Mother   . Kidney disease Father     Review of Systems  Constitutional: Negative for chills and fever.  Respiratory: Positive for cough (little ) and shortness of breath (chronic). Negative for wheezing.   Cardiovascular: Positive for chest pain, palpitations and leg swelling.  Gastrointestinal: Positive for abdominal pain (RUQ pain). Negative for blood in stool (sometimes black), constipation and diarrhea.       Gerd  Musculoskeletal: Positive for arthralgias.       Muscle cramping  Neurological: Negative for light-headedness and headaches.  Psychiatric/Behavioral: Positive for dysphoric mood (at times due to limitations physically/not working). The patient is not nervous/anxious.        Objective:   Vitals:   04/24/18 1355  BP: (!) 160/90  Pulse: 98  Resp: 18  Temp: 98.7 F (37.1 C)  SpO2: 97%   BP Readings from Last 3 Encounters:  04/24/18 (!) 160/90  04/20/18 (!) 178/108  02/02/18 (!) 142/98   Wt Readings from Last 3 Encounters:  04/24/18 (!) 348 lb 12.8 oz (158.2 kg)  04/19/18 (!) 350 lb (158.8 kg)  02/02/18 (!) 338 lb (153.3 kg)   Body mass index is 51.51 kg/m.   Physical Exam    Constitutional: Appears well-developed and well-nourished. No distress.  HENT:  Head: Normocephalic and atraumatic.  Neck: Neck supple. No tracheal deviation present. No  thyromegaly present.  No cervical lymphadenopathy Cardiovascular: Normal rate, regular rhythm and normal heart sounds.   No murmur heard. No carotid bruit .  Trace LE edema b/l Pulmonary/Chest: Effort normal and breath sounds normal. No respiratory distress. No has no wheezes.  No rales.  Abdomen: soft, NT,ND Skin: Skin is warm and dry. Not diaphoretic.  Psychiatric: Normal mood and affect. Behavior is normal.      Assessment & Plan:    See Problem List for Assessment and Plan of chronic medical problems.

## 2018-04-24 ENCOUNTER — Encounter: Payer: Self-pay | Admitting: Internal Medicine

## 2018-04-24 ENCOUNTER — Ambulatory Visit: Payer: 59 | Admitting: Internal Medicine

## 2018-04-24 ENCOUNTER — Other Ambulatory Visit (INDEPENDENT_AMBULATORY_CARE_PROVIDER_SITE_OTHER): Payer: 59

## 2018-04-24 VITALS — BP 160/90 | HR 98 | Temp 98.7°F | Resp 18 | Ht 69.0 in | Wt 348.8 lb

## 2018-04-24 DIAGNOSIS — E782 Mixed hyperlipidemia: Secondary | ICD-10-CM

## 2018-04-24 DIAGNOSIS — J45909 Unspecified asthma, uncomplicated: Secondary | ICD-10-CM | POA: Diagnosis not present

## 2018-04-24 DIAGNOSIS — R739 Hyperglycemia, unspecified: Secondary | ICD-10-CM

## 2018-04-24 DIAGNOSIS — R0602 Shortness of breath: Secondary | ICD-10-CM | POA: Insufficient documentation

## 2018-04-24 DIAGNOSIS — K219 Gastro-esophageal reflux disease without esophagitis: Secondary | ICD-10-CM

## 2018-04-24 DIAGNOSIS — K921 Melena: Secondary | ICD-10-CM | POA: Insufficient documentation

## 2018-04-24 DIAGNOSIS — R1013 Epigastric pain: Secondary | ICD-10-CM | POA: Insufficient documentation

## 2018-04-24 DIAGNOSIS — I5032 Chronic diastolic (congestive) heart failure: Secondary | ICD-10-CM

## 2018-04-24 DIAGNOSIS — R252 Cramp and spasm: Secondary | ICD-10-CM | POA: Diagnosis not present

## 2018-04-24 DIAGNOSIS — G4733 Obstructive sleep apnea (adult) (pediatric): Secondary | ICD-10-CM | POA: Insufficient documentation

## 2018-04-24 DIAGNOSIS — I10 Essential (primary) hypertension: Secondary | ICD-10-CM

## 2018-04-24 DIAGNOSIS — M170 Bilateral primary osteoarthritis of knee: Secondary | ICD-10-CM | POA: Insufficient documentation

## 2018-04-24 LAB — LIPID PANEL
Cholesterol: 262 mg/dL — ABNORMAL HIGH (ref 0–200)
HDL: 31.5 mg/dL — ABNORMAL LOW (ref >39.00)
LDL Cholesterol: 201 mg/dL — ABNORMAL HIGH (ref 0–99)
NONHDL: 230.55
Total CHOL/HDL Ratio: 8
Triglycerides: 149 mg/dL (ref 0.0–149.0)
VLDL: 29.8 mg/dL (ref 0.0–40.0)

## 2018-04-24 LAB — CBC WITH DIFFERENTIAL/PLATELET
Basophils Absolute: 0.1 10*3/uL (ref 0.0–0.1)
Basophils Relative: 1.1 % (ref 0.0–3.0)
EOS PCT: 2 % (ref 0.0–5.0)
Eosinophils Absolute: 0.1 10*3/uL (ref 0.0–0.7)
HCT: 42.3 % (ref 39.0–52.0)
Hemoglobin: 13.9 g/dL (ref 13.0–17.0)
Lymphocytes Relative: 36.6 % (ref 12.0–46.0)
Lymphs Abs: 2.2 10*3/uL (ref 0.7–4.0)
MCHC: 32.9 g/dL (ref 30.0–36.0)
MCV: 81.8 fl (ref 78.0–100.0)
Monocytes Absolute: 0.6 10*3/uL (ref 0.1–1.0)
Monocytes Relative: 9.3 % (ref 3.0–12.0)
Neutro Abs: 3 10*3/uL (ref 1.4–7.7)
Neutrophils Relative %: 51 % (ref 43.0–77.0)
Platelets: 255 10*3/uL (ref 150.0–400.0)
RBC: 5.17 Mil/uL (ref 4.22–5.81)
RDW: 15.7 % — ABNORMAL HIGH (ref 11.5–15.5)
WBC: 6 10*3/uL (ref 4.0–10.5)

## 2018-04-24 LAB — COMPREHENSIVE METABOLIC PANEL
ALT: 20 U/L (ref 0–53)
AST: 15 U/L (ref 0–37)
Albumin: 4.2 g/dL (ref 3.5–5.2)
Alkaline Phosphatase: 29 U/L — ABNORMAL LOW (ref 39–117)
BUN: 10 mg/dL (ref 6–23)
CO2: 27 meq/L (ref 19–32)
Calcium: 9.4 mg/dL (ref 8.4–10.5)
Chloride: 100 mEq/L (ref 96–112)
Creatinine, Ser: 0.8 mg/dL (ref 0.40–1.50)
GFR: 126.39 mL/min (ref >60.00)
Glucose, Bld: 124 mg/dL — ABNORMAL HIGH (ref 70–99)
Potassium: 4.2 mEq/L (ref 3.5–5.1)
Sodium: 135 mEq/L (ref 135–145)
Total Bilirubin: 0.4 mg/dL (ref 0.2–1.2)
Total Protein: 7.5 g/dL (ref 6.0–8.3)

## 2018-04-24 LAB — HEMOGLOBIN A1C: Hgb A1c MFr Bld: 7 % — ABNORMAL HIGH (ref 4.6–6.5)

## 2018-04-24 LAB — TSH: TSH: 1.86 u[IU]/mL (ref 0.35–4.50)

## 2018-04-24 LAB — MAGNESIUM: Magnesium: 1.7 mg/dL (ref 1.5–2.5)

## 2018-04-24 MED ORDER — LISINOPRIL 5 MG PO TABS: 5.0000 mg | ORAL_TABLET | Freq: Every day | ORAL | 5 refills | Status: DC

## 2018-04-24 MED ORDER — ALBUTEROL SULFATE HFA 108 (90 BASE) MCG/ACT IN AERS: 1.0000 | INHALATION_SPRAY | Freq: Four times a day (QID) | RESPIRATORY_TRACT | 5 refills | Status: AC | PRN

## 2018-04-24 MED ORDER — PANTOPRAZOLE SODIUM 40 MG PO TBEC: 40.0000 mg | DELAYED_RELEASE_TABLET | Freq: Every day | ORAL | 5 refills | Status: DC

## 2018-04-24 NOTE — Assessment & Plan Note (Addendum)
Severe Not on cpap - needs a new machine, but likely needs a new sleep study Referred to pulm

## 2018-04-24 NOTE — Assessment & Plan Note (Signed)
Increase water - stop sweet tea and soda Cmp, mag

## 2018-04-24 NOTE — Assessment & Plan Note (Signed)
Last echo looked good - will get records from prior cardiologist Mild diastolic dysfunction On lasix 40 mg BID, coreg Need to get BP better controlled Having SOB - possibly multifactorial - needs cardiac eval - will refer Discussed that deconditioning and obesity likely contributing

## 2018-04-24 NOTE — Assessment & Plan Note (Signed)
Not controlled Continue coreg Did not tolerate amlodipine and lisinopril Should be on lisinopril - will retry 5 mg daily - if not tolerated will increase coreg F/u in 4 weeks

## 2018-04-24 NOTE — Assessment & Plan Note (Addendum)
?   Related to heart failure per patient - did not have asthma as a child Continue inhalers To see pulm

## 2018-04-24 NOTE — Assessment & Plan Note (Signed)
a1c

## 2018-04-24 NOTE — Patient Instructions (Addendum)
  Tests ordered today. Your results will be released to MyChart (or called to you) after review, usually within 72hours after test completion. If any changes need to be made, you will be notified at that same time.   Medications reviewed and updated.  Changes include :   Start lisinopril 5 mg daily  Your prescription(s) have been submitted to your pharmacy. Please take as directed and contact our office if you believe you are having problem(s) with the medication(s).  A referral was ordered for Cardiology, pulmonary and sports medicine   Please followup in 4 weeks

## 2018-04-24 NOTE — Assessment & Plan Note (Signed)
Check lipid panel  Continue daily statin Regular exercise and healthy diet encouraged  

## 2018-04-24 NOTE — Assessment & Plan Note (Signed)
Has had intermittent black stool, epigastric pain, GERd - not controlled Restart protonix Cbc, ifob

## 2018-04-24 NOTE — Assessment & Plan Note (Signed)
Was getting injections in knees for OA Has pain in b/l knees - would like injections Will refer to Dr Katrinka Blazing

## 2018-04-24 NOTE — Assessment & Plan Note (Signed)
As above - concern for gastritis, possible ulcer protonix Cbc ifob

## 2018-04-24 NOTE — Assessment & Plan Note (Signed)
Likely multifactorial Has some HFpEF, asthma, obesity and deconditioning To see cardio, pulm

## 2018-04-26 ENCOUNTER — Encounter: Payer: Self-pay | Admitting: Internal Medicine

## 2018-04-26 DIAGNOSIS — E119 Type 2 diabetes mellitus without complications: Secondary | ICD-10-CM | POA: Insufficient documentation

## 2018-04-27 ENCOUNTER — Other Ambulatory Visit: Payer: Self-pay | Admitting: Internal Medicine

## 2018-04-27 MED ORDER — ROSUVASTATIN CALCIUM 40 MG PO TABS: 40.0000 mg | ORAL_TABLET | Freq: Every day | ORAL | 3 refills | Status: AC

## 2018-05-09 ENCOUNTER — Encounter: Payer: Self-pay | Admitting: Internal Medicine

## 2018-05-12 ENCOUNTER — Ambulatory Visit: Payer: PRIVATE HEALTH INSURANCE | Admitting: Family

## 2018-05-17 ENCOUNTER — Telehealth: Payer: Self-pay

## 2018-05-17 NOTE — Telephone Encounter (Signed)
   Cardiac Questionnaire:    Since your last visit or hospitalization:    1. Have you been having new or worsening chest pain? YES   2. Have you been having new or worsening shortness of breath? YES 3. Have you been having new or worsening leg swelling, wt gain, or increase in abdominal girth (pants fitting more tightly)? YES   4. Have you had any passing out spells? NO    Patient answered yes to the cardiac questionnaire and appointment will be converted to a  E visit ( 05/19/18 at 11 am) patient is aware of reschedule e visit date and time and knows he will be contacted 15 minutes before appointment time. PATIENT requested mychart access and was sent mychart activation to his email ([email or web address])

## 2018-05-19 ENCOUNTER — Telehealth: Payer: 59 | Admitting: Internal Medicine

## 2018-05-19 ENCOUNTER — Telehealth: Payer: Self-pay

## 2018-05-19 NOTE — Telephone Encounter (Signed)
Contacted the patient to initiate the e-visit walk through for the patient 11:00am e-visit scheduled with Dr.Acharya.  Message received when the pt phone number is dialed. "the subscriber is not currently accepting incoming calls" then it goes to a busy signal and disconnects.  Will attempt again in 10 minutes.

## 2018-05-19 NOTE — Telephone Encounter (Signed)
2nd attempt to contact the pt unsuccessfully with the same message received as documented below.  Per Dr.Acharya if the pt contacts the office his e-visit can be rescheduled for 05/22/18 in the afternoon, or another day next week.

## 2018-05-21 NOTE — Assessment & Plan Note (Deleted)
New diagnosis 

## 2018-05-21 NOTE — Progress Notes (Deleted)
Subjective:    Patient ID: William Benitez, male    DOB: 16-Feb-1971, 48 y.o.   MRN: [id number]  HPI The patient is here for follow up.  Hyperlipidemia: He is taking his medication daily. He is compliant with a low fat/cholesterol diet. He denies myalgias.   Diabetes: He is taking his medication daily as prescribed. He is compliant with a diabetic diet.  He monitors his sugars and they have been running XXX. He checks his feet daily and denies foot lesions. He is up-to-date with an ophthalmology examination.   HFpEF, Hypertension: He is taking his medication daily, including coreg BID, lasix BID and lisinopril daily.  He is compliant with a low sodium diet.  He denies chest pain, palpitations, edema, shortness of breath and regular headaches.  He does not monitor his blood pressure at home.    Epigastric pain, GERD:  ? Gastritis.  His protonix was restarted at his last visit and he has been taking it daily.    Medications and allergies reviewed with patient and updated if appropriate.  Patient Active Problem List   Diagnosis Date Noted  . Diabetes mellitus without complication (HCC) 04/26/2018  . SOB (shortness of breath) 04/24/2018  . OSA (obstructive sleep apnea) 04/24/2018  . Bilateral primary osteoarthritis of knee 04/24/2018  . Muscle cramping 04/24/2018  . GERD (gastroesophageal reflux disease) 04/24/2018  . Epigastric pain 04/24/2018  . Black stool 04/24/2018  . Asthma 04/23/2018  . Hyperlipidemia 04/23/2018  . Chronic diastolic heart failure (HCC)   . Hypertension     Current Outpatient Medications on File Prior to Visit  Medication Sig Dispense Refill  . albuterol (PROVENTIL HFA;VENTOLIN HFA) 108 (90 Base) MCG/ACT inhaler Inhale 1-2 puffs into the lungs every 6 (six) hours as needed for wheezing or shortness of breath. 1 Inhaler 5  . carvedilol (COREG) 12.5 MG tablet Take 1 tablet (12.5 mg total) by mouth 2 (two) times daily with a meal. 60 tablet 0  . ciclesonide  (ALVESCO) 160 MCG/ACT inhaler Inhale 1 puff into the lungs 2 (two) times daily.    . furosemide (LASIX) 40 MG tablet Take 1 tablet (40 mg total) by mouth 2 (two) times daily. 60 tablet 0  . lisinopril (PRINIVIL,ZESTRIL) 5 MG tablet Take 1 tablet (5 mg total) by mouth daily. 30 tablet 5  . pantoprazole (PROTONIX) 40 MG tablet Take 1 tablet (40 mg total) by mouth daily. 30 tablet 5  . rosuvastatin (CRESTOR) 40 MG tablet Take 1 tablet (40 mg total) by mouth daily. 90 tablet 3   No current facility-administered medications on file prior to visit.     Past Medical History:  Diagnosis Date  . Arthritis   . Asthma   . CHF (congestive heart failure) (HCC)   . Hypertension   . Sleep apnea     No past surgical history on file.  Social History   Socioeconomic History  . Marital status: Married    Spouse name: Not on file  . Number of children: Not on file  . Years of education: Not on file  . Highest education level: Not on file  Occupational History  . Not on file  Social Needs  . Financial resource strain: Not on file  . Food insecurity:    Worry: Not on file    Inability: Not on file  . Transportation needs:    Medical: Not on file    Non-medical: Not on file  Tobacco Use  . Smoking status: Former Games developer  .  Smokeless tobacco: Never Used  Substance and Sexual Activity  . Alcohol use: No    Frequency: Never  . Drug use: Yes    Types: Marijuana    Comment: occ  . Sexual activity: Not on file  Lifestyle  . Physical activity:    Days per week: Not on file    Minutes per session: Not on file  . Stress: Not on file  Relationships  . Social connections:    Talks on phone: Not on file    Gets together: Not on file    Attends religious service: Not on file    Active member of club or organization: Not on file    Attends meetings of clubs or organizations: Not on file    Relationship status: Not on file  Other Topics Concern  . Not on file  Social History Narrative  . Not  on file    Family History  Problem Relation Age of Onset  . Hypertension Mother   . Liver disease Mother   . Kidney disease Father     Review of Systems     Objective:  There were no vitals filed for this visit. BP Readings from Last 3 Encounters:  04/24/18 (!) 160/90  04/20/18 (!) 178/108  02/02/18 (!) 142/98   Wt Readings from Last 3 Encounters:  04/24/18 (!) 348 lb 12.8 oz (158.2 kg)  04/19/18 (!) 350 lb (158.8 kg)  02/02/18 (!) 338 lb (153.3 kg)   There is no height or weight on file to calculate BMI.   Physical Exam    Constitutional: Appears well-developed and well-nourished. No distress.  HENT:  Head: Normocephalic and atraumatic.  Neck: Neck supple. No tracheal deviation present. No thyromegaly present.  No cervical lymphadenopathy Cardiovascular: Normal rate, regular rhythm and normal heart sounds.   No murmur heard. No carotid bruit .  No edema Pulmonary/Chest: Effort normal and breath sounds normal. No respiratory distress. No has no wheezes. No rales.  Skin: Skin is warm and dry. Not diaphoretic.  Psychiatric: Normal mood and affect. Behavior is normal.      Assessment & Plan:    See Problem List for Assessment and Plan of chronic medical problems.

## 2018-05-22 ENCOUNTER — Ambulatory Visit: Payer: Self-pay

## 2018-05-22 ENCOUNTER — Ambulatory Visit: Payer: 59 | Admitting: Internal Medicine

## 2018-05-22 ENCOUNTER — Ambulatory Visit: Payer: Self-pay | Admitting: Internal Medicine

## 2018-05-22 NOTE — Telephone Encounter (Signed)
Pt calling and was expecting a virtual visit with PCP and pt stated no one called him. Triaged pt who is having moderate SOB, wheezing. Pt also c/o abdominal bloating and "stomach pain." Pt stated that he is unable to lie flat and if he does he has to catch his breath.  Pt is [place] currently. Denies fever, chest pain. Pt has h/o CHF and OSA. Pt c/o occasional runny nose and occasional cough.  Care advice given and pt verbalized understanding. Pt stated he will go to the ED.  Reason for Disposition . [1] MODERATE difficulty breathing (e.g., speaks in phrases, SOB even at rest, pulse 100-120) AND [2] NEW-onset or WORSE than normal  Answer Assessment - Initial Assessment Questions 1. RESPIRATORY STATUS: "Describe your breathing?" (e.g., wheezing, shortness of breath, unable to speak, severe coughing)      SOB with exertion,  wheezing 2. ONSET: "When did this breathing problem begin?"      1 week ago 3. PATTERN "Does the difficult breathing come and go, or has it been constant since it started?"      SOB 4. SEVERITY: "How bad is your breathing?" (e.g., mild, moderate, severe)    - MILD: No SOB at rest, mild SOB with walking, speaks normally in sentences, can lay down, no retractions, pulse < 100.    - MODERATE: SOB at rest, SOB with minimal exertion and prefers to sit, cannot lie down flat, speaks in phrases, mild retractions, audible wheezing, pulse 100-120.    - SEVERE: Very SOB at rest, speaks in single words, struggling to breathe, sitting hunched forward, retractions, pulse > 120     moderate 5.RECURRENT SYMPTOM: "Have you had difficulty breathing before?" If so, ask: "When was the last time?" and "What happened that time?"      Yes but not as bad 6. CARDIAC HISTORY: "Do you have any history of heart disease?" (e.g., heart attack, angina, bypass surgery, angioplasty)     CHF 7. LUNG HISTORY: "Do you have any history of lung disease?"  (e.g., pulmonary embolus, asthma, emphysema)     No-  saw pulmonary with [facility] and cardiologist 8. CAUSE: "What do you think is causing the breathing problem?"      CHF 9. OTHER SYMPTOMS: "Do you have any other symptoms? (e.g., dizziness, runny nose, cough, chest pain, fever)     Occasional runny nose, coughing 10. PREGNANCY: "Is there any chance you are pregnant?" "When was your last menstrual period?"       no 11. TRAVEL: "Have you traveled out of the country in the last month?" (e.g., travel history, exposures)      no  Protocols used: BREATHING DIFFICULTY-A-AH

## 2018-05-23 ENCOUNTER — Ambulatory Visit: Payer: Self-pay | Admitting: Internal Medicine

## 2018-05-23 NOTE — Telephone Encounter (Signed)
Spoke with pt and advised that with the sxs he has and him being all the way in [place] he should go to the hospital. Pt stated he preferred not to go there due to the corona virus outbreak. Pt did not want to do a virtual visit but preferred to have a direct OV with Dr. Lawerance Bach when he gets back. I set up an appointment with Dr. Lawerance Bach on 05/30/00 @ 2:30 and advised pt to go to the ED if sxs get worse. Pt expressed understanding.

## 2018-05-26 ENCOUNTER — Telehealth: Payer: Self-pay | Admitting: General Practice

## 2018-05-29 NOTE — Telephone Encounter (Signed)
closed

## 2018-05-30 NOTE — Progress Notes (Signed)
Subjective:    Patient ID: William Benitez, male    DOB: 09-10-1970, 48 y.o.   MRN: [id number]  HPI The patient is here for follow up.  He is not exercising regularly.   He is too SOB.   Diabetes:  This is a new diagnosis for him - his a1c one month ago was 7. He is not compliant with a diabetic diet, but is working on it.  He has stopped eating fried foods and eating less carbs over the past week.    Diastolic Heart failure, LVH, Hypertension: He is taking his medication daily, except the lisinopril which he didn't know he was supposed to be taking. He is not always compliant with a low sodium diet.  He is SOB with exertion.  He is SOB when showering.  He can not walk through a grocery store.  He has intermittent palpitations.  He denies chest pain, edema now (controlled)and regular headaches.  He does not monitor his blood pressure at home.     Hyperlipidemia: He is taking his atorvastatin, but not daily. I changed him to crestor but he has not started that.  He is not compliant with a low fat/cholesterol diet, but trying to improve his diet. He denies myalgias.   Asthma: he has occasional wheeze.  He uses his inhaler as needed.  He does have seasonal allergies.  He takes claritin as needed, but is not taking it now.    Severe OSA: he feels tired throughout the day.  He will see pulmonary eventually.  He is not able to tolerate the cpap machine he has.     GERD:  He is taking his medication daily as prescribed.  He denies any GERD symptoms and feels his GERD is well controlled.      Medications and allergies reviewed with patient and updated if appropriate.  Patient Active Problem List   Diagnosis Date Noted  . Diabetes mellitus without complication (HCC) 04/26/2018  . SOB (shortness of breath) 04/24/2018  . OSA (obstructive sleep apnea) 04/24/2018  . Bilateral primary osteoarthritis of knee 04/24/2018  . Muscle cramping 04/24/2018  . GERD (gastroesophageal reflux disease)  04/24/2018  . Epigastric pain 04/24/2018  . Black stool 04/24/2018  . Asthma 04/23/2018  . Hyperlipidemia 04/23/2018  . Chronic diastolic heart failure (HCC)   . Hypertension     Current Outpatient Medications on File Prior to Visit  Medication Sig Dispense Refill  . albuterol (PROVENTIL HFA;VENTOLIN HFA) 108 (90 Base) MCG/ACT inhaler Inhale 1-2 puffs into the lungs every 6 (six) hours as needed for wheezing or shortness of breath. 1 Inhaler 5  . carvedilol (COREG) 12.5 MG tablet Take 1 tablet (12.5 mg total) by mouth 2 (two) times daily with a meal. 60 tablet 0  . ciclesonide (ALVESCO) 160 MCG/ACT inhaler Inhale 1 puff into the lungs 2 (two) times daily.    . furosemide (LASIX) 40 MG tablet Take 1 tablet (40 mg total) by mouth 2 (two) times daily. 60 tablet 0  . lisinopril (PRINIVIL,ZESTRIL) 5 MG tablet Take 1 tablet (5 mg total) by mouth daily. 30 tablet 5  . pantoprazole (PROTONIX) 40 MG tablet Take 1 tablet (40 mg total) by mouth daily. 30 tablet 5  . rosuvastatin (CRESTOR) 40 MG tablet Take 1 tablet (40 mg total) by mouth daily. 90 tablet 3   No current facility-administered medications on file prior to visit.     Past Medical History:  Diagnosis Date  . Arthritis   .  Asthma   . CHF (congestive heart failure) (HCC)   . Hypertension   . Sleep apnea     No past surgical history on file.  Social History   Socioeconomic History  . Marital status: Married    Spouse name: Not on file  . Number of children: Not on file  . Years of education: Not on file  . Highest education level: Not on file  Occupational History  . Not on file  Social Needs  . Financial resource strain: Not on file  . Food insecurity:    Worry: Not on file    Inability: Not on file  . Transportation needs:    Medical: Not on file    Non-medical: Not on file  Tobacco Use  . Smoking status: Former Games developer  . Smokeless tobacco: Never Used  Substance and Sexual Activity  . Alcohol use: No     Frequency: Never  . Drug use: Yes    Types: Marijuana    Comment: occ  . Sexual activity: Not on file  Lifestyle  . Physical activity:    Days per week: Not on file    Minutes per session: Not on file  . Stress: Not on file  Relationships  . Social connections:    Talks on phone: Not on file    Gets together: Not on file    Attends religious service: Not on file    Active member of club or organization: Not on file    Attends meetings of clubs or organizations: Not on file    Relationship status: Not on file  Other Topics Concern  . Not on file  Social History Narrative  . Not on file    Family History  Problem Relation Age of Onset  . Hypertension Mother   . Liver disease Mother   . Kidney disease Father     Review of Systems  Constitutional: Negative for chills and fever.  Respiratory: Positive for shortness of breath (with exertion) and wheezing (occasional -not right now). Negative for cough.   Cardiovascular: Positive for palpitations (at rest and with activity) and leg swelling (controlled). Negative for chest pain.  Neurological: Positive for light-headedness (occasionally). Negative for headaches.       Objective:   Vitals:   05/31/18 1417  BP: (!) 164/88  Pulse: 94  Resp: 18  Temp: 99 F (37.2 C)  SpO2: 97%   BP Readings from Last 3 Encounters:  05/31/18 (!) 164/88  04/24/18 (!) 160/90  04/20/18 (!) 178/108   Wt Readings from Last 3 Encounters:  05/31/18 (!) 342 lb (155.1 kg)  04/24/18 (!) 348 lb 12.8 oz (158.2 kg)  04/19/18 (!) 350 lb (158.8 kg)   Body mass index is 50.5 kg/m.   Physical Exam    Constitutional: Appears well-developed and well-nourished. No distress.  HENT:  Head: Normocephalic and atraumatic.  Neck: Neck supple. No tracheal deviation present. No thyromegaly present.  No cervical lymphadenopathy Cardiovascular: Normal rate, regular rhythm and normal heart sounds.   No murmur heard. No carotid bruit .  No edema  Pulmonary/Chest: Effort normal and breath sounds normal. No respiratory distress. No has no wheezes. No rales.  Skin: Skin is warm and dry. Not diaphoretic.  Psychiatric: Normal mood and affect. Behavior is normal.      Assessment & Plan:    See Problem List for Assessment and Plan of chronic medical problems.

## 2018-05-31 ENCOUNTER — Ambulatory Visit (INDEPENDENT_AMBULATORY_CARE_PROVIDER_SITE_OTHER): Payer: 59 | Admitting: Internal Medicine

## 2018-05-31 ENCOUNTER — Encounter: Payer: Self-pay | Admitting: Internal Medicine

## 2018-05-31 ENCOUNTER — Other Ambulatory Visit: Payer: Self-pay

## 2018-05-31 VITALS — BP 164/88 | HR 94 | Temp 99.0°F | Resp 18 | Ht 69.0 in | Wt 342.0 lb

## 2018-05-31 DIAGNOSIS — E782 Mixed hyperlipidemia: Secondary | ICD-10-CM

## 2018-05-31 DIAGNOSIS — G4733 Obstructive sleep apnea (adult) (pediatric): Secondary | ICD-10-CM

## 2018-05-31 DIAGNOSIS — I10 Essential (primary) hypertension: Secondary | ICD-10-CM

## 2018-05-31 DIAGNOSIS — R0602 Shortness of breath: Secondary | ICD-10-CM

## 2018-05-31 DIAGNOSIS — E119 Type 2 diabetes mellitus without complications: Secondary | ICD-10-CM

## 2018-05-31 DIAGNOSIS — I5032 Chronic diastolic (congestive) heart failure: Secondary | ICD-10-CM

## 2018-05-31 DIAGNOSIS — J45909 Unspecified asthma, uncomplicated: Secondary | ICD-10-CM

## 2018-05-31 DIAGNOSIS — K219 Gastro-esophageal reflux disease without esophagitis: Secondary | ICD-10-CM

## 2018-05-31 MED ORDER — CARVEDILOL 25 MG PO TABS: 25.0000 mg | ORAL_TABLET | Freq: Two times a day (BID) | ORAL | 3 refills | Status: AC

## 2018-05-31 MED ORDER — LISINOPRIL 5 MG PO TABS: 5.0000 mg | ORAL_TABLET | Freq: Every day | ORAL | 5 refills | Status: DC

## 2018-05-31 MED ORDER — METFORMIN HCL 500 MG PO TABS: 500.0000 mg | ORAL_TABLET | Freq: Two times a day (BID) | ORAL | 3 refills | Status: AC

## 2018-05-31 MED ORDER — PANTOPRAZOLE SODIUM 40 MG PO TBEC: 40.0000 mg | DELAYED_RELEASE_TABLET | Freq: Every day | ORAL | 5 refills | Status: AC

## 2018-05-31 NOTE — Assessment & Plan Note (Signed)
GERD controlled Continue pantoprazole daily

## 2018-05-31 NOTE — Assessment & Plan Note (Addendum)
Switched from atorvastatin 40 mg to rosuvastatin 40 mg -  He has not switched yet but will pick it up soon Stressed that he needs to take the cholesterol medication daily-he is not currently taking it daily Will recheck lipid panel in a few months Continue to work on improving diet and weight loss I did refer him to nutritionist-he is aware he may not be able to see them for a while

## 2018-05-31 NOTE — Assessment & Plan Note (Signed)
Still has significant shortness of breath with exertion only Again discussed this is likely multifactorial Encouraged him to continue weight loss efforts-he is making diet changes and he has lost a small amount of weight since he was here last Has been referred to cardiology and pulmonary Diastolic heart failure, asthma, OSA, obesity and physical deconditioning all likely contributing Not fluid overloaded on exam Follow-up in 1 month

## 2018-05-31 NOTE — Assessment & Plan Note (Signed)
Not using CPAP-does not tolerate his CPAP machine Sleep apnea is severe He has lost some weight since he was here last-stressed the importance of trying to lose more weight Has been referred to pulmonary and they have contacted him

## 2018-05-31 NOTE — Assessment & Plan Note (Signed)
This is a new diagnosis for him Discussed the importance of decreasing sugar/carbohydrates Unable to exercise due to shortness of breath Encouraged him to continue to work on weight loss We will start metformin 500 mg twice daily-if he has loose stools/diarrhea he will call He is interested in seeing nutritionist-I will order the referral, but advised he may not be able to see them for a little while

## 2018-05-31 NOTE — Assessment & Plan Note (Signed)
Occasional wheeze-likely from allergies Uses albuterol as needed-continue Advised to take Claritin if he is feeling more allergy symptoms

## 2018-05-31 NOTE — Assessment & Plan Note (Signed)
Appears euvolemic Continue furosemide at current dose He will be establishing with cardiology eventually Stressed the importance of getting his blood pressure controlled Will increase carvedilol to 25 mg twice daily Follow-up in 1 month

## 2018-05-31 NOTE — Assessment & Plan Note (Signed)
Poorly controlled He is not taking lisinopril-recent pharmacy and advised him to take this once daily.  If he does have side effects he will let me know Increase carvedilol to 25 mg twice daily Continue Lasix 40 mg twice daily Follow-up in 1 month

## 2018-05-31 NOTE — Patient Instructions (Addendum)
  Medications reviewed and updated.  Changes include :    Start lisinopril 5 mg daily.   Increase coreg to 25 mg twice daily.   Start metformin 500 mg twice daily with food Continue pantoprazole  Your prescription(s) have been submitted to your pharmacy. Please take as directed and contact our office if you believe you are having problem(s) with the medication(s).    Please followup in 1  Month

## 2018-06-07 ENCOUNTER — Encounter: Payer: 59 | Attending: Internal Medicine | Admitting: Dietician

## 2018-06-07 ENCOUNTER — Encounter: Payer: Self-pay | Admitting: Dietician

## 2018-06-07 ENCOUNTER — Other Ambulatory Visit: Payer: Self-pay

## 2018-06-07 DIAGNOSIS — E119 Type 2 diabetes mellitus without complications: Secondary | ICD-10-CM | POA: Insufficient documentation

## 2018-06-07 NOTE — Progress Notes (Signed)
Diabetes Self-Management Education  Visit Type: First/Initial   Telephone Visit  Appt. Start Time: 3:30pm Appt. End Time: 5:00pm  06/07/2018  I connected with William Benitez on 06/07/2018 at 3:30 PM EDT by telephone at home and verified that I am speaking with the correct person using two identifiers.   I discussed the limitations, risks, security and privacy concerns of performing an evaluation and management service by telephone and the availability of in person appointments. I also discussed with the patient that there may be a patient responsible charge related to this service. The patient expressed understanding and agreed to proceed.   ASSESSMENT  There were no vitals taken for this visit. There is no height or weight on file to calculate BMI.   Diagnosed with diabetes on 05/31/2018.  Biochemical/Labs (taken 05/31/2018) BP: 164/88 HgbA1c: 7.0% (H) Cholesterol: 262 (H) LDL: 201 (H) HDL: 31.5 (L) TG: 149  Glucose: 124 (H)  Clinical Medical Hx: sleep apnea, HTN, CHF, asthma, arthritis, GERD, T2DM, hyperlipidemia, SOB, osteoarthritis   Medications: Metformin, Lisinopril, Furosemide, Pantoprazole, Carvedilol, Rosuvastatin, Alvesco inhaler  Allergies: NKA   Food/Nutrition Pt states he rarely eats breakfast. Does not usually snack between meals. Since being diagnosed 1 week ago, has been eating more sides with his meals (such as potatoes and broccoli) and less fried entrees. Consumes fried foods (primarily fried chicken) at least once daily. Pt states he rarely drinks water. Pt states he likes dairy foods but rarely eats them, and there aren't many foods he dislikes.    Diabetes Self-Management Education - 06/07/18 1541      Visit Information   Visit Type  First/Initial      Initial Visit   Diabetes Type  Type 2    Are you currently following a meal plan?  No    Are you taking your medications as prescribed?  Yes      Health Coping   How would you rate your overall health?   Very Poor      Psychosocial Assessment   Patient Belief/Attitude about Diabetes  Denial    Self-care barriers  Lack of transportation   usually asks someone else for a ride   Self-management support  Friends    Other persons present  Patient    Patient Concerns  Glycemic Control    Special Needs  None    Preferred Learning Style  No preference indicated    Learning Readiness  Contemplating    How often do you need to have someone help you when you read instructions, pamphlets, or other written materials from your doctor or pharmacy?  1 - Never    What is the last grade level you completed in school?  technical degree (heat/air)       Pre-Education Assessment   Patient understands the diabetes disease and treatment process.  Needs Instruction    Patient understands incorporating nutritional management into lifestyle.  Needs Instruction    Patient undertands incorporating physical activity into lifestyle.  Needs Instruction    Patient understands using medications safely.  Needs Instruction    Patient understands monitoring blood glucose, interpreting and using results  Needs Instruction    Patient understands prevention, detection, and treatment of acute complications.  Needs Instruction    Patient understands prevention, detection, and treatment of chronic complications.  Needs Instruction    Patient understands how to develop strategies to address psychosocial issues.  Needs Instruction    Patient understands how to develop strategies to promote health/change behavior.  Needs Instruction  Complications   Last HgB A1C per patient/outside source  7 %    How often do you check your blood sugar?  0 times/day (not testing)    Have you had a dilated eye exam in the past 12 months?  No    Have you had a dental exam in the past 12 months?  Yes    Are you checking your feet?  No      Dietary Intake   Breakfast  eggs + sausage/bacon + grits    rarely eats breakfast   Snack (morning)   none    Lunch  fried chicken + potatoes (fries or mashed potatoes)     Snack (afternoon)  none    Dinner  fried pork chops + rice w/ gravy + broccoli     Snack (evening)  none    Beverage(s)  sweet tea + lemonade + sodas (Dr. Reino Kent, Coke, Pepsi)      Exercise   Exercise Type  ADL's;Light (walking / raking leaves)   Can't walk much without being out of breath   How many days per week to you exercise?  0    How many minutes per day do you exercise?  0    Total minutes per week of exercise  0      Patient Education   Previous Diabetes Education  No    Disease state   Definition of diabetes, type 1 and 2, and the diagnosis of diabetes;Explored patient's options for treatment of their diabetes;Factors that contribute to the development of diabetes    Nutrition management   Role of diet in the treatment of diabetes and the relationship between the three main macronutrients and blood glucose level    Physical activity and exercise   Role of exercise on diabetes management, blood pressure control and cardiac health.;Helped patient identify appropriate exercises in relation to his/her diabetes, diabetes complications and other health issue.    Monitoring  Daily foot exams;Yearly dilated eye exam;Interpreting lab values - A1C, lipid, urine microalbumina.   Pt was not provided info on a BG meter upon diagnosis but expressed interest in having one.   Acute complications  Discussed and identified patients' treatment of hyperglycemia.;Taught treatment of hypoglycemia - the 15 rule.    Chronic complications  Relationship between chronic complications and blood glucose control;Lipid levels, blood glucose control and heart disease;Assessed and discussed foot care and prevention of foot problems;Identified and discussed with patient  current chronic complications;Retinopathy and reason for yearly dilated eye exams;Reviewed with patient heart disease, higher risk of, and prevention      Individualized Goals  (developed by patient)   Nutrition  Follow meal plan discussed;Other (comment)   Goal: To cut back on fried foods by eating them no more than 2 to 3 times per week.   Physical Activity  Exercise 5-7 days per week   Goal: To walk 10 minutes at a time 2 times every day.    Medications  take my medication as prescribed    Monitoring   Other (comment)   Check with insurance company to see coverage options available for blood glucose meters/strips.      Post-Education Assessment   Patient understands the diabetes disease and treatment process.  Needs Review    Patient understands incorporating nutritional management into lifestyle.  Needs Instruction    Patient undertands incorporating physical activity into lifestyle.  Needs Instruction    Patient understands using medications safely.  Needs Review    Patient understands  monitoring blood glucose, interpreting and using results  Needs Instruction    Patient understands prevention, detection, and treatment of acute complications.  Needs Review    Patient understands prevention, detection, and treatment of chronic complications.  Needs Review    Patient understands how to develop strategies to address psychosocial issues.  Needs Instruction    Patient understands how to develop strategies to promote health/change behavior.  Needs Instruction      Outcomes   Expected Outcomes  Demonstrated interest in learning. Expect positive outcomes    Future DMSE  4-6 wks    Program Status  Not Completed       Individualized Plan for Diabetes Self-Management Training:  Learning Objective:  Patient will have a greater understanding of diabetes self-management. Patient education plan is to attend individual and/or group sessions per assessed needs and concerns.   Plan:  Patient Instructions  Thank you for taking the time to speak with me today! Here is a reminder of the goals you chose to work on until your follow up visit:   Goal 1: To cut back on  fried foods by eating them no more than 2 to 3 times per week. (Eventually, the goal will be to limit them even more.)   Goal 2: To walk 10 minutes at a time 2 times every day. (The eventual goal is 150 minutes per week, so walking 20 minutes every day is very close to that!)   Reminder: Check with your insurance company on what type of blood glucose meter they will cover, and see if your doctor is able to write a prescription for your meter and strips.   See you for your follow up appointment on Wednesday, May 20th at 11:00am.    Expected Outcomes:  Demonstrated interest in learning. Expect positive outcomes  Education material provided:  If problems or questions, patient to contact team via: Phone and Email  Future DSME appointment: 4-6 wks

## 2018-06-07 NOTE — Patient Instructions (Addendum)
Thank you for taking the time to speak with me today! Here is a reminder of the goals you chose to work on until your follow up visit:   Goal 1: To cut back on fried foods by eating them no more than 2 to 3 times per week. (Eventually, the goal will be to limit them even more.)   Goal 2: To walk 10 minutes at a time 2 times every day. (The eventual goal is 150 minutes per week, so walking 20 minutes every day is very close to that!)   Reminder: Check with your insurance company on what type of blood glucose meter they will cover, and see if your doctor is able to write a prescription for your meter and strips.   See you for your follow up appointment on Wednesday, May 20th at 11:00am.

## 2018-07-03 NOTE — Progress Notes (Signed)
Virtual Visit via Video Note  I connected with William Benitez on 07/04/18 at 10:15 AM EDT by a video enabled telemedicine application and verified that I am speaking with the correct person using two identifiers.   I discussed the limitations of evaluation and management by telemedicine and the availability of in person appointments. The patient expressed understanding and agreed to proceed.  The patient is currently at home and I am in the office.    No referring provider.    History of Present Illness: He is here for follow up and for an acute problem.   Cellulitis:  He has an area on his back that red, warm and tender.  He does have a history of cellulitis and was concerned that that is what this is.  He denies any fevers or chills.  It is painful when he lays on that area.  There has been no discharge.  Hypertension: He is taking his medication daily.  He recently ran out of the lisinopril and will pick up his refill at the pharmacy today.  he is not compliant with a low sodium diet.    He does not monitor his blood pressure at home.       Review of Systems  Constitutional: Negative for chills and fever.  Respiratory: Positive for shortness of breath (with exertion - no change) and wheezing. Negative for cough.   Cardiovascular: Negative for chest pain, palpitations and leg swelling.  Skin:       Redness on back  Neurological: Positive for headaches.     Social History   Socioeconomic History  . Marital status: Married    Spouse name: Not on file  . Number of children: Not on file  . Years of education: Not on file  . Highest education level: Not on file  Occupational History  . Not on file  Social Needs  . Financial resource strain: Not on file  . Food insecurity:    Worry: Not on file    Inability: Not on file  . Transportation needs:    Medical: Not on file    Non-medical: Not on file  Tobacco Use  . Smoking status: Former Games developer  . Smokeless tobacco: Never  Used  Substance and Sexual Activity  . Alcohol use: No    Frequency: Never  . Drug use: Yes    Types: Marijuana    Comment: occ  . Sexual activity: Not on file  Lifestyle  . Physical activity:    Days per week: Not on file    Minutes per session: Not on file  . Stress: Not on file  Relationships  . Social connections:    Talks on phone: Not on file    Gets together: Not on file    Attends religious service: Not on file    Active member of club or organization: Not on file    Attends meetings of clubs or organizations: Not on file    Relationship status: Not on file  Other Topics Concern  . Not on file  Social History Narrative  . Not on file     Observations/Objective: Appears well in NAD Recently saw area only non-was an area of erythema, no active discharge, further details difficult to visualize  Assessment and Plan:  See Problem List for Assessment and Plan of chronic medical problems.   Follow Up Instructions:    I discussed the assessment and treatment plan with the patient. The patient was provided an opportunity to ask questions  and all were answered. The patient agreed with the plan and demonstrated an understanding of the instructions.   The patient was advised to call back or seek an in-person evaluation if the symptoms worsen or if the condition fails to improve as anticipated.    Binnie Rail, MD

## 2018-07-04 ENCOUNTER — Ambulatory Visit (INDEPENDENT_AMBULATORY_CARE_PROVIDER_SITE_OTHER): Payer: 59 | Admitting: Internal Medicine

## 2018-07-04 ENCOUNTER — Encounter: Payer: Self-pay | Admitting: Internal Medicine

## 2018-07-04 DIAGNOSIS — L089 Local infection of the skin and subcutaneous tissue, unspecified: Secondary | ICD-10-CM | POA: Insufficient documentation

## 2018-07-04 DIAGNOSIS — L729 Follicular cyst of the skin and subcutaneous tissue, unspecified: Secondary | ICD-10-CM

## 2018-07-04 DIAGNOSIS — I10 Essential (primary) hypertension: Secondary | ICD-10-CM

## 2018-07-04 MED ORDER — DOXYCYCLINE HYCLATE 100 MG PO CAPS: 100.0000 mg | ORAL_CAPSULE | Freq: Two times a day (BID) | ORAL | 0 refills | Status: DC

## 2018-07-04 NOTE — Assessment & Plan Note (Signed)
He ran out of his lisinopril, but has been taking his other medications He states he has started to have headaches since running out of the lisinopril- Stressed taking his medication daily and not running out He does not monitor his blood pressure Advised to follow-up in the office 2-4 weeks to recheck blood pressure make sure it is better controlled

## 2018-07-04 NOTE — Assessment & Plan Note (Signed)
He is not infected cyst or boil on his back Start doxycycline twice daily x10 days He did request something for pain, but advised for him to take ibuprofen as needed Call if no improvement or with any questions/concerns

## 2018-07-10 ENCOUNTER — Telehealth: Payer: Self-pay | Admitting: Internal Medicine

## 2018-07-10 NOTE — Telephone Encounter (Signed)
-----   Message from Pincus Sanes, MD sent at 07/04/2018 10:42 AM EDT ----- He needs an in office f/u with me in 2-4 weeks - htn, dm

## 2018-07-10 NOTE — Telephone Encounter (Signed)
Tried to call patient to schedule. No answer and no voicemail. 

## 2018-07-12 ENCOUNTER — Ambulatory Visit: Payer: 59 | Admitting: Dietician

## 2018-07-18 NOTE — Telephone Encounter (Signed)
Tried to call patient again. No answer and no voicemail.

## 2018-07-21 ENCOUNTER — Telehealth: Payer: Self-pay

## 2018-07-21 NOTE — Telephone Encounter (Signed)
LM2CB-please schedule new virtual appt when pt CB

## 2018-07-21 NOTE — Telephone Encounter (Signed)
-----   Message from Parke Poisson, MD sent at 07/20/2018  6:04 PM EDT ----- Regarding: RE: another new pt This patient was previously on my schedule for virtual visit, so yes that is appropriate. Please add him to my schedule.  GA ----- Message ----- From: Alyson Ingles, LPN Sent: 04/12/7586   2:39 PM EDT To: Parke Poisson, MD Subject: another new pt                                 Pt c/o SOB, HTN and new DM . Please review for new pt appt Thank you, Marcelino Duster

## 2018-07-27 NOTE — Telephone Encounter (Signed)
LM2CB-please schedule new pt appt with Dr Jacques Navy

## 2018-07-31 NOTE — Progress Notes (Signed)
Cardiology Office Note:    Date:  08/01/2018   ID:  William MassedMarlan Shenker, DOB [birth year], MRN [id number]  PCP:  Pincus SanesBurns, Stacy J, MD  Cardiologist:  Parke PoissonGayatri A Patty Lopezgarcia, MD  Electrophysiologist:  None   Referring MD: Pincus SanesBurns, Stacy J, MD   Chief Complaint: DOE and chest pain  History of Present Illness:    William Benitez is a 48 y.o. male with a hx of asthma, diastolic heart failure, hypertension, sleep apnea, and arthritis who presents for shortness of breath primarily with exertion.  He has several concerns, has [place]ologists outside [place]. He has been on lisinopril but notes headaches with this medication. He has not yet tried alternate therapy.   He continues on furosemide but continues to have SOB and leg swelling.   He notes a history of PCI, however I do not have access to [facility] to verify anatomy or year that this was placed. He is uncertain if he has a stent or not. If he has a stent, he is not currently on aspirin therapy which would be recommended. He feels he has never been told to take aspirin.   He has sleep apnea and is currently noncompliant with CPAP. He has yet to establish with sleep medicine to revisit therapy.   His symptoms today include dyspnea with minimal exertion including showering and walking around the house. He endorses bendopnea, and chest pain with minimal exertion. He also feels he has orthopnea at times, but is unsure if his symptoms relate to sleep apnea or heart failure.    He notes that in 2018 he had a hospitalization for "asthma" but at the time required Bipap and oxygen. He does not endorse significant daily asthma symptoms or hospitalizations with intubation.  He is aware of heart failure recommendations. He does not weigh daily but does try to limit fluid and salt.  The patient denies palpitations. Denies syncope or presyncope. Denies dizziness or lightheadedness.  Past Medical History:  Diagnosis Date  . Arthritis   .  Asthma   . CHF (congestive heart failure) (HCC)   . Hypertension   . Sleep apnea     No past surgical history on file.  Current Medications: Current Meds  Medication Sig  . albuterol (PROVENTIL HFA;VENTOLIN HFA) 108 (90 Base) MCG/ACT inhaler Inhale 1-2 puffs into the lungs every 6 (six) hours as needed for wheezing or shortness of breath.  . carvedilol (COREG) 25 MG tablet Take 1 tablet (25 mg total) by mouth 2 (two) times daily with a meal.  . ciclesonide (ALVESCO) 160 MCG/ACT inhaler Inhale 1 puff into the lungs 2 (two) times daily.  Marland Kitchen. doxycycline (VIBRAMYCIN) 100 MG capsule Take 1 capsule (100 mg total) by mouth 2 (two) times daily.  . furosemide (LASIX) 40 MG tablet Take 1 tablet (40 mg total) by mouth 2 (two) times daily.  . metFORMIN (GLUCOPHAGE) 500 MG tablet Take 1 tablet (500 mg total) by mouth 2 (two) times daily with a meal.  . pantoprazole (PROTONIX) 40 MG tablet Take 1 tablet (40 mg total) by mouth daily.  . rosuvastatin (CRESTOR) 40 MG tablet Take 1 tablet (40 mg total) by mouth daily.  . [DISCONTINUED] lisinopril (PRINIVIL,ZESTRIL) 5 MG tablet Take 1 tablet (5 mg total) by mouth daily.     Allergies:   Patient has no known allergies.   Social History   Socioeconomic History  . Marital status: Married    Spouse name: Not on file  . Number of children:  Not on file  . Years of education: Not on file  . Highest education level: Not on file  Occupational History  . Not on file  Social Needs  . Financial resource strain: Not on file  . Food insecurity    Worry: Not on file    Inability: Not on file  . Transportation needs    Medical: Not on file    Non-medical: Not on file  Tobacco Use  . Smoking status: Former Games developermoker  . Smokeless tobacco: Never Used  Substance and Sexual Activity  . Alcohol use: No    Frequency: Never  . Drug use: Yes    Types: Marijuana    Comment: occ  . Sexual activity: Not on file  Lifestyle  . Physical activity    Days per week:  Not on file    Minutes per session: Not on file  . Stress: Not on file  Relationships  . Social Musicianconnections    Talks on phone: Not on file    Gets together: Not on file    Attends religious service: Not on file    Active member of club or organization: Not on file    Attends meetings of clubs or organizations: Not on file    Relationship status: Not on file  Other Topics Concern  . Not on file  Social History Narrative  . Not on file     Family History: The patient's family history includes Hypertension in his mother; Kidney disease in his father; Liver disease in his mother.  ROS:   Please see the history of present illness.    All other systems reviewed and are negative.  EKGs/Labs/Other Studies Reviewed:    The following studies were reviewed today:  EKG:  NSR, rate 100, T wave abnormality (unchanged from prior ECG 04/20/2018).   Recent Labs: 04/20/2018: B Natriuretic Peptide 60.9 04/24/2018: ALT 20; BUN 10; Creatinine, Ser 0.80; Hemoglobin 13.9; Magnesium 1.7; Platelets 255.0; Potassium 4.2; Sodium 135; TSH 1.86  Recent Lipid Panel    Component Value Date/Time   CHOL 262 (H) 04/24/2018 1519   TRIG 149.0 04/24/2018 1519   HDL 31.50 (L) 04/24/2018 1519   CHOLHDL 8 04/24/2018 1519   VLDL 29.8 04/24/2018 1519   LDLCALC 201 (H) 04/24/2018 1519    Physical Exam:    VS:  BP 121/75   Pulse 100   Ht 5\' 9"  (1.753 m)   Wt (!) 357 lb 9.6 oz (162.2 kg)   BMI 52.81 kg/m     Wt Readings from Last 5 Encounters:  08/01/18 (!) 357 lb 9.6 oz (162.2 kg)  05/31/18 (!) 342 lb (155.1 kg)  04/24/18 (!) 348 lb 12.8 oz (158.2 kg)  04/19/18 (!) 350 lb (158.8 kg)  02/02/18 (!) 338 lb (153.3 kg)     Constitutional: No acute distress Eyes: sclera non-icteric, normal conjunctiva and lids ENMT: normal dentition, moist mucous membranes Cardiovascular: regular rhythm, normal rate, no murmurs. S1 and S2 normal. No jugular venous distention.  Respiratory: clear to auscultation  bilaterally GI : normal bowel sounds, soft and nontender. No distention.   MSK: extremities warm, well perfused. Trace bilateral edema.  NEURO: grossly nonfocal exam, moves all extremities. PSYCH: alert and oriented x 3, normal mood and affect.   ASSESSMENT:    1. DOE (dyspnea on exertion)   2. Exertional chest pain   3. OSA (obstructive sleep apnea)   4. Chronic diastolic heart failure (HCC)   5. Essential hypertension    PLAN:  DOE/CP - because he has significant symptoms of shortness of breath with exertion and concerning symptoms of chest discomfort, we need to evaluate for systolic and diastolic function, structural/myocardial issues. This can be accomplished with an echo. In addition he will require an ischemia evaluation due to risk factors. A myoview stress test on the D SPECT camera at our [facility] office will be ordered for this evaluation. He reports a history of CAD with PCI. If this is in fact the case, he will need indefinite aspirin 81 mg therapy to avoid in stent thrombosis. He denies bleeding complications.   OSA - he is not currently compliant with CPAP for over a year. We will refer for a sleep study for known OSA and CPAP optimization.   Chronic diastolic HF - he should weigh daily and continue lasix. We will assess myocardial structure and function and determine if further therapy is warranted.   HTN - he endorses a headache with lisinopril. Denies cough. We will transition to losartan and evaluate for improvement. Likely that headache is contributed to by untreated OSA. Can stop lisinopril.  TIME SPENT WITH PATIENT: 40 minutes of direct patient care. More than 50% of that time was spent on coordination of care and counseling regarding management of chronic conditions, medication adjustments, and testing recommendations.  Cherlynn Kaiser, MD [place]  [facility]   Medication Adjustments/Labs and Tests Ordered: Current medicines are reviewed at length  with the patient today.  Concerns regarding medicines are outlined above.  Orders Placed This Encounter  Procedures  . MYOCARDIAL PERFUSION IMAGING  . EKG 12-Lead  . ECHOCARDIOGRAM COMPLETE   Meds ordered this encounter  Medications  . losartan (COZAAR) 25 MG tablet    Sig: Take 1 tablet (25 mg total) by mouth daily.    Dispense:  90 tablet    Refill:  2    Patient Instructions  Medication Instructions:  Stop lisinopril   Start  TAKING losartan 25 mg one tablet day  START TAKING  ASPIRIN 81 MG DAILY   If you need a refill on your cardiac medications before your next appointment, please call your pharmacy.   Lab work: Not needed   .  Testing/Procedures: WILL BE SCHEDULE AT [place] Your physician has requested that you have an echocardiogram. Echocardiography is a painless test that uses sound waves to create images of your heart. It provides your doctor with information about the size and shape of your heart and how well your heart's chambers and valves are working. This procedure takes approximately one hour. There are no restrictions for this procedure.  AND   Your physician has requested that you have a lexiscan myoview. For further information please visit [email or web address]. Please follow instruction sheet, as given.    Follow-Up: At [facility], you and your health needs are our priority.  As part of our continuing mission to provide you with exceptional heart care, we have created designated Provider Care Teams.  These Care Teams include your primary Cardiologist (physician) and Advanced Practice Providers (APPs -  Physician Assistants and Nurse Practitioners) who all work together to provide you with the care you need, when you need it. . You will need a follow up appointment in 6 weeks .  Please call our office   [employer] may see  one of the following Advanced Practice Providers on your designated Care Team:   . Rosaria Ferries, PA-C . Jory Sims, DNP,  ANP  Your physician recommends that you schedule a follow-up appointment in [facility] -- [facility]  WIL CONTACT YOU ABOUT APPT.   Any Other Special Instructions Will Be Listed Below (If Applicable).

## 2018-07-31 NOTE — Telephone Encounter (Signed)
TRIED TO CB-PHONE JUST KEEPS RINGING, NO VM

## 2018-08-01 ENCOUNTER — Telehealth: Payer: Self-pay | Admitting: *Deleted

## 2018-08-01 ENCOUNTER — Other Ambulatory Visit: Payer: Self-pay

## 2018-08-01 ENCOUNTER — Ambulatory Visit (INDEPENDENT_AMBULATORY_CARE_PROVIDER_SITE_OTHER): Payer: 59 | Admitting: Internal Medicine

## 2018-08-01 ENCOUNTER — Encounter: Payer: Self-pay | Admitting: Internal Medicine

## 2018-08-01 VITALS — BP 121/75 | HR 100 | Ht 69.0 in | Wt 357.6 lb

## 2018-08-01 DIAGNOSIS — I5032 Chronic diastolic (congestive) heart failure: Secondary | ICD-10-CM | POA: Diagnosis not present

## 2018-08-01 DIAGNOSIS — R0609 Other forms of dyspnea: Secondary | ICD-10-CM

## 2018-08-01 DIAGNOSIS — R079 Chest pain, unspecified: Secondary | ICD-10-CM

## 2018-08-01 DIAGNOSIS — G4733 Obstructive sleep apnea (adult) (pediatric): Secondary | ICD-10-CM | POA: Diagnosis not present

## 2018-08-01 DIAGNOSIS — I10 Essential (primary) hypertension: Secondary | ICD-10-CM

## 2018-08-01 MED ORDER — LOSARTAN POTASSIUM 25 MG PO TABS: 25.0000 mg | ORAL_TABLET | Freq: Every day | ORAL | 2 refills | Status: AC

## 2018-08-01 NOTE — Telephone Encounter (Signed)
LEFT MESSAGE FOR PATIENT TO CALL BACK - NEED TO RESCHEDULE APPOINTMENT  FOR VIRTUAL VISIT TODAY S VISIT 08/01/18

## 2018-08-01 NOTE — Patient Instructions (Addendum)
Medication Instructions:  Stop lisinopril   Start  TAKING losartan 25 mg one tablet day  START TAKING  ASPIRIN 81 MG DAILY   If you need a refill on your cardiac medications before your next appointment, please call your pharmacy.   Lab work: Not needed   .  Testing/Procedures: WILL BE SCHEDULE AT [place] Your physician has requested that you have an echocardiogram. Echocardiography is a painless test that uses sound waves to create images of your heart. It provides your doctor with information about the size and shape of your heart and how well your heart's chambers and valves are working. This procedure takes approximately one hour. There are no restrictions for this procedure.  AND   Your physician has requested that you have a lexiscan myoview. For further information please visit [email or web address]. Please follow instruction sheet, as given.    Follow-Up: At [facility], you and your health needs are our priority.  As part of our continuing mission to provide you with exceptional heart care, we have created designated Provider Care Teams.  These Care Teams include your primary Cardiologist (physician) and Advanced Practice Providers (APPs -  Physician Assistants and Nurse Practitioners) who all work together to provide you with the care you need, when you need it. . You will need a follow up appointment in 6 weeks .  Please call our office   [employer] may see  one of the following Advanced Practice Providers on your designated Care Team:   . Rosaria Ferries, PA-C . Jory Sims, DNP, ANP  Your physician recommends that you schedule a follow-up appointment in [place] -- [facility]  WIL CONTACT YOU ABOUT APPT.   Any Other Special Instructions Will Be Listed Below (If Applicable).

## 2018-08-02 NOTE — Telephone Encounter (Signed)
Appt yesterday-

## 2018-08-02 NOTE — Progress Notes (Deleted)
Virtual Visit via Video Note  I connected with William Benitez on 08/02/18 at 10:00 AM EDT by a video enabled telemedicine application and verified that I am speaking with the correct person using two identifiers.   I discussed the limitations of evaluation and management by telemedicine and the availability of in person appointments. The patient expressed understanding and agreed to proceed.  The patient is currently at home and I am in the office.    No referring provider.    History of Present Illness: This visit is follow up for cellulitis.    On 5/12 we had a virtual visit for a possible infected cyst on his back - I prescribed doxycycline x 10 days.     Social History   Socioeconomic History  . Marital status: Married    Spouse name: Not on file  . Number of children: Not on file  . Years of education: Not on file  . Highest education level: Not on file  Occupational History  . Not on file  Social Needs  . Financial resource strain: Not on file  . Food insecurity:    Worry: Not on file    Inability: Not on file  . Transportation needs:    Medical: Not on file    Non-medical: Not on file  Tobacco Use  . Smoking status: Former Research scientist (life sciences)  . Smokeless tobacco: Never Used  Substance and Sexual Activity  . Alcohol use: No    Frequency: Never  . Drug use: Yes    Types: Marijuana    Comment: occ  . Sexual activity: Not on file  Lifestyle  . Physical activity:    Days per week: Not on file    Minutes per session: Not on file  . Stress: Not on file  Relationships  . Social connections:    Talks on phone: Not on file    Gets together: Not on file    Attends religious service: Not on file    Active member of club or organization: Not on file    Attends meetings of clubs or organizations: Not on file    Relationship status: Not on file  Other Topics Concern  . Not on file  Social History Narrative  . Not on file     Observations/Objective: Appears well in  NAD   Assessment and Plan:  See Problem List for Assessment and Plan of chronic medical problems.   Follow Up Instructions:    I discussed the assessment and treatment plan with the patient. The patient was provided an opportunity to ask questions and all were answered. The patient agreed with the plan and demonstrated an understanding of the instructions.   The patient was advised to call back or seek an in-person evaluation if the symptoms worsen or if the condition fails to improve as anticipated.    Binnie Rail, MD

## 2018-08-03 ENCOUNTER — Ambulatory Visit: Payer: 59 | Admitting: Internal Medicine

## 2018-08-03 ENCOUNTER — Other Ambulatory Visit: Payer: Self-pay

## 2018-08-07 ENCOUNTER — Encounter: Payer: Self-pay | Admitting: Internal Medicine

## 2018-08-10 ENCOUNTER — Encounter: Payer: 59 | Admitting: Internal Medicine

## 2018-08-10 NOTE — Progress Notes (Signed)
Virtual Visit via Video Note  I connected with William Benitez on 08/10/18 at  4:15 PM EDT by a video enabled telemedicine application and verified that I am speaking with the correct person using two identifiers.   I discussed the limitations of evaluation and management by telemedicine and the availability of in person appointments. The patient expressed understanding and agreed to proceed.  The patient is currently at home and I am in the office.    No referring provider.    History of Present Illness:   NO SHOW     Social History   Socioeconomic History  . Marital status: Married    Spouse name: Not on file  . Number of children: Not on file  . Years of education: Not on file  . Highest education level: Not on file  Occupational History  . Not on file  Social Needs  . Financial resource strain: Not on file  . Food insecurity    Worry: Not on file    Inability: Not on file  . Transportation needs    Medical: Not on file    Non-medical: Not on file  Tobacco Use  . Smoking status: Former Research scientist (life sciences)  . Smokeless tobacco: Never Used  Substance and Sexual Activity  . Alcohol use: No    Frequency: Never  . Drug use: Yes    Types: Marijuana    Comment: occ  . Sexual activity: Not on file  Lifestyle  . Physical activity    Days per week: Not on file    Minutes per session: Not on file  . Stress: Not on file  Relationships  . Social Herbalist on phone: Not on file    Gets together: Not on file    Attends religious service: Not on file    Active member of club or organization: Not on file    Attends meetings of clubs or organizations: Not on file    Relationship status: Not on file  Other Topics Concern  . Not on file  Social History Narrative  . Not on file     Observations/Objective:     Assessment and Plan:  See Problem List for Assessment and Plan of chronic medical problems.   Follow Up Instructions:    I discussed the assessment and  treatment plan with the patient. The patient was provided an opportunity to ask questions and all were answered. The patient agreed with the plan and demonstrated an understanding of the instructions.   The patient was advised to call back or seek an in-person evaluation if the symptoms worsen or if the condition fails to improve as anticipated.    Binnie Rail, MD  This encounter was created in error - please disregard.

## 2018-08-17 ENCOUNTER — Telehealth: Payer: Self-pay | Admitting: Internal Medicine

## 2018-08-17 NOTE — Telephone Encounter (Signed)
Tried to call patient again. No answer and unable to leave voicemail (phone keeps ringing).

## 2018-08-17 NOTE — Telephone Encounter (Signed)
Patient called to reschedule his missed appointment from last week. He is experiencing stomach spasms and shortness of breath. He would like to be seen in the office and only by you.  Okay to schedule in office visit? He does not have any other symptoms.

## 2018-08-17 NOTE — Telephone Encounter (Signed)
Tried to call patient to inform and schedule.  No answer and unable to leave a message.  Will call back later.

## 2018-08-17 NOTE — Telephone Encounter (Signed)
[place] for in office.    Let him know he has no showed twice for his virtual visits with me and if he continues to no show to his appointments I will not be able to be his doctor any longer because this prevents other patients that need to be seen from having an appointment.

## 2018-08-28 NOTE — Progress Notes (Signed)
Virtual Visit via Video Note  I connected with William Benitez on 08/29/18 at 10:45 AM EDT by a video enabled telemedicine application and verified that I am speaking with the correct person using two identifiers.   I discussed the limitations of evaluation and management by telemedicine and the availability of in person appointments. The patient expressed understanding and agreed to proceed.  The patient is currently at home and I am in the office.    No referring provider.    History of Present Illness: This is an acute visit for SOB and bite on foot.    Bite on Left foot - he went to urgent care in [facility] and was prescribed keflex 500mg  BID x 7 days and advil.  He completed those and it is better, but still swollen and tender.  It is a tiny bit warm.  It has not resolved.  He denies fever or chills. It is painful to put on his shoe.    SOB:  He continues to have SOB and it is worse in the heat and humidity.  He tries to avoid going out.  He has seen cardiology, but has not had the Echo yet.  He has not scheduled an appointment with pulmonary yet.  He has a history of heart failure and was on disability for it in the past and his disability is up for renewal. I explained to him that at this point I do not have a diagnosis that qualifies him for disability and he needs to get this testing done and see the specialists.     Review of Systems  Constitutional: Negative for chills and fever.  Respiratory: Positive for shortness of breath and wheezing (little occaisonally). Negative for cough.   Cardiovascular: Positive for leg swelling. Negative for chest pain.      Social History   Socioeconomic History  . Marital status: Married    Spouse name: Not on file  . Number of children: Not on file  . Years of education: Not on file  . Highest education level: Not on file  Occupational History  . Not on file  Social Needs  . Financial resource strain: Not on file  . Food insecurity   Worry: Not on file    Inability: Not on file  . Transportation needs    Medical: Not on file    Non-medical: Not on file  Tobacco Use  . Smoking status: Former Research scientist (life sciences)  . Smokeless tobacco: Never Used  Substance and Sexual Activity  . Alcohol use: No    Frequency: Never  . Drug use: Yes    Types: Marijuana    Comment: occ  . Sexual activity: Not on file  Lifestyle  . Physical activity    Days per week: Not on file    Minutes per session: Not on file  . Stress: Not on file  Relationships  . Social Herbalist on phone: Not on file    Gets together: Not on file    Attends religious service: Not on file    Active member of club or organization: Not on file    Attends meetings of clubs or organizations: Not on file    Relationship status: Not on file  Other Topics Concern  . Not on file  Social History Narrative  . Not on file     Observations/Objective: Appears well in NAD Breathing normally without shortness of breath I was able to briefly see his left foot-there was hyperpigmentation, mild  swelling on the dorsal aspect of his foot-he states it is tender  Assessment and Plan:  See Problem List for Assessment and Plan of chronic medical problems.   Follow Up Instructions:    I discussed the assessment and treatment plan with the patient. The patient was provided an opportunity to ask questions and all were answered. The patient agreed with the plan and demonstrated an understanding of the instructions.   The patient was advised to call back or seek an in-person evaluation if the symptoms worsen or if the condition fails to improve as anticipated.  Follow-up to be scheduled with me in office in September  Pincus SanesStacy J Elton Heid, MD

## 2018-08-29 ENCOUNTER — Ambulatory Visit (INDEPENDENT_AMBULATORY_CARE_PROVIDER_SITE_OTHER): Payer: 59 | Admitting: Internal Medicine

## 2018-08-29 ENCOUNTER — Encounter: Payer: Self-pay | Admitting: Internal Medicine

## 2018-08-29 DIAGNOSIS — R0602 Shortness of breath: Secondary | ICD-10-CM | POA: Diagnosis not present

## 2018-08-29 DIAGNOSIS — L03116 Cellulitis of left lower limb: Secondary | ICD-10-CM | POA: Diagnosis not present

## 2018-08-29 MED ORDER — SULFAMETHOXAZOLE-TRIMETHOPRIM 800-160 MG PO TABS: 1.0000 | ORAL_TABLET | Freq: Two times a day (BID) | ORAL | 0 refills | Status: DC

## 2018-08-29 NOTE — Assessment & Plan Note (Signed)
He continues to have shortness of breath with exertion Stressed to him that he needs to schedule the test with cardiology and schedule an appoint with pulmonary He likely does have obstructive sleep apnea and needs to start treatment for this Avoid going outside with excessive heat and humidity Albuterol as needed

## 2018-08-29 NOTE — Assessment & Plan Note (Signed)
Related to an insect bite Was seen at urgent care in [place] and completed 7 days of Keflex, but symptoms are still present although improved Will treat with an additional 7 days of Bactrim twice daily He will call if his symptoms do not improve

## 2018-09-05 ENCOUNTER — Encounter: Payer: Self-pay | Admitting: Pulmonary Disease

## 2018-09-05 ENCOUNTER — Other Ambulatory Visit: Payer: Self-pay

## 2018-09-05 ENCOUNTER — Ambulatory Visit (INDEPENDENT_AMBULATORY_CARE_PROVIDER_SITE_OTHER): Payer: 59 | Admitting: Pulmonary Disease

## 2018-09-05 ENCOUNTER — Telehealth (HOSPITAL_COMMUNITY): Payer: Self-pay

## 2018-09-05 VITALS — BP 140/80 | HR 97 | Temp 98.5°F | Ht 69.0 in | Wt 354.6 lb

## 2018-09-05 DIAGNOSIS — R0602 Shortness of breath: Secondary | ICD-10-CM

## 2018-09-05 DIAGNOSIS — I5032 Chronic diastolic (congestive) heart failure: Secondary | ICD-10-CM

## 2018-09-05 DIAGNOSIS — G4733 Obstructive sleep apnea (adult) (pediatric): Secondary | ICD-10-CM | POA: Diagnosis not present

## 2018-09-05 DIAGNOSIS — R079 Chest pain, unspecified: Secondary | ICD-10-CM

## 2018-09-05 DIAGNOSIS — R0609 Other forms of dyspnea: Secondary | ICD-10-CM

## 2018-09-05 DIAGNOSIS — I10 Essential (primary) hypertension: Secondary | ICD-10-CM

## 2018-09-05 NOTE — Patient Instructions (Signed)
Shortness of breath Obstructive sleep apnea not being treated at present  We will order an in lab sleep study-split-night study Order pulmonary function study  We will see you back in the office in about 4 to 6 weeks  Encouraged to continue working on weight loss efforts High risk of decompensation if obstructive sleep apnea is not treated

## 2018-09-05 NOTE — Progress Notes (Signed)
Subjective:    Patient ID: William Benitez, male    DOB: January 24, 1971, 48 y.o.   MRN: [id number]  Patient being seen for obstructive sleep  He was diagnosed with obstructive sleep apnea about 2 years ago Only used CPAP for 1 night and said he could not tolerated The mask did not fit very well  He is sleepy all the time Wakes up like his not had a good night rest Disorganized sleep habits Will finally fall asleep about 6 AM Able to sleep until late 11-12 Can fall asleep easily during the day  Admits to snoring  He has history of congestive heart failure, history of diabetes  He is short of breath with most activities     Review of Systems  Constitutional: Negative for fever and unexpected weight change.  HENT: Negative for congestion, dental problem, ear pain, nosebleeds, postnasal drip, rhinorrhea, sinus pressure, sneezing, sore throat and trouble swallowing.   Eyes: Negative for redness and itching.  Respiratory: Positive for shortness of breath. Negative for chest tightness and wheezing.   Cardiovascular: Positive for leg swelling. Negative for palpitations.  Gastrointestinal: Negative for nausea and vomiting.  Genitourinary: Negative for dysuria.  Musculoskeletal: Negative for joint swelling.  Skin: Negative for rash.  Allergic/Immunologic: Negative.  Negative for environmental allergies, food allergies and immunocompromised state.  Neurological: Negative for headaches.  Hematological: Does not bruise/bleed easily.  Psychiatric/Behavioral: Negative for dysphoric mood. The patient is not nervous/anxious.    Past Medical History:  Diagnosis Date  . Arthritis   . Asthma   . CHF (congestive heart failure) (HCC)   . Hypertension   . Sleep apnea    Social History   Socioeconomic History  . Marital status: Married    Spouse name: Not on file  . Number of children: Not on file  . Years of education: Not on file  . Highest education level: Not on file  Occupational  History  . Not on file  Social Needs  . Financial resource strain: Not on file  . Food insecurity    Worry: Not on file    Inability: Not on file  . Transportation needs    Medical: Not on file    Non-medical: Not on file  Tobacco Use  . Smoking status: Former Games developermoker  . Smokeless tobacco: Never Used  Substance and Sexual Activity  . Alcohol use: No    Frequency: Never  . Drug use: Yes    Types: Marijuana    Comment: occ  . Sexual activity: Not on file  Lifestyle  . Physical activity    Days per week: Not on file    Minutes per session: Not on file  . Stress: Not on file  Relationships  . Social Musicianconnections    Talks on phone: Not on file    Gets together: Not on file    Attends religious service: Not on file    Active member of club or organization: Not on file    Attends meetings of clubs or organizations: Not on file    Relationship status: Not on file  . Intimate partner violence    Fear of current or ex partner: Not on file    Emotionally abused: Not on file    Physically abused: Not on file    Forced sexual activity: Not on file  Other Topics Concern  . Not on file  Social History Narrative  . Not on file   Family History  Problem Relation Age of Onset  .  Hypertension Mother   . Liver disease Mother   . Kidney disease Father       Objective:   Physical Exam Constitutional:      Appearance: Normal appearance.     Comments: Morbidly obese  HENT:     Head: Normocephalic and atraumatic.  Neck:     Musculoskeletal: Normal range of motion and neck supple. No neck rigidity or muscular tenderness.  Cardiovascular:     Rate and Rhythm: Normal rate and regular rhythm.  Pulmonary:     Effort: Pulmonary effort is normal.     Comments: Decreased air entry bilaterally secondary to body habitus Abdominal:     Palpations: There is no mass.     Tenderness: There is no abdominal tenderness.  Musculoskeletal: Normal range of motion.  Skin:    General: Skin is  warm and dry.     Coloration: Skin is not jaundiced or pale.  Neurological:     General: No focal deficit present.     Mental Status: He is alert and oriented to person, place, and time.     Cranial Nerves: No cranial nerve deficit.  Psychiatric:        Mood and Affect: Mood normal.        Behavior: Behavior normal.    Vitals:   09/05/18 1524  BP: 140/80  Pulse: 97  Temp: 98.5 F (36.9 C)  SpO2: 98%    Results of the Epworth flowsheet 09/05/2018  Sitting and reading 3  Watching TV 3  Sitting, inactive in a public place (e.g. a theatre or a meeting) 3  As a passenger in a car for an hour without a break 3  Lying down to rest in the afternoon when circumstances permit 3  Sitting and talking to someone 3  Sitting quietly after a lunch without alcohol 3  In a car, while stopped for a few minutes in traffic 2  Total score 23      Assessment & Plan:  .  Obstructive sleep apnea-untreated at present  .  Morbid obesity  .  History of heart failure.  Marland Kitchen  History of diabetes  .  Severe excessive daytime sleepiness  We will schedule patient for an in lab sleep study We will work with him to try and find him a mask that may work better for him  The importance of weight loss and weight control discussed  I did advise the patient that he needs to have the obstructive sleep apnea treated to reduce his risk of harm He needs to work on losing weight -In my humble opinion, he seems not to want to take responsibility and try and address his morbid obesity and participate in treating his health problems  -I did discuss the pathophysiology of sleep disordered breathing with him and how it is treated -He did ask about implantable device -I did let him know that this is used in patients who fail CPAP which he has not given a chance  We will see him in about 6 weeks

## 2018-09-05 NOTE — Telephone Encounter (Signed)
Spoke to patient - he states  He want to keep appointment for 09/15/18 - and he wold like to have both test done.  patient is aware  Scheduler will contact patient tomorrow to set up appointment - echo , lexiscan

## 2018-09-05 NOTE — Telephone Encounter (Signed)
FYI

## 2018-09-05 NOTE — Telephone Encounter (Signed)
New message    Just an FYI. We have made several attempts to contact this patient including sending a letter to schedule or reschedule their echocardiogram & MYOCARDIAL PERFUSION. We will be removing the patient from the WQ.   7.14.20 @ 11:38  both # are the same lm on home vm - William Benitez 6.29.20 @ 9:47am phone just rings - cell phone William Benitez  6.23.20 @ 11:15am cell phone first contact #  - phone rings no vm comes up William Benitez  6.15.20 @ 11:36am unable to leave a message  - mail reminder letter William Benitez

## 2018-09-05 NOTE — Telephone Encounter (Signed)
Patient has not scheduled his cardiac testing. Follow up on 7/24 should be rescheduled to after his testing is complete. He can be reschedule with one of my APP colleagues in August.  Thanks, [place]

## 2018-09-06 ENCOUNTER — Encounter: Payer: Self-pay | Admitting: Internal Medicine

## 2018-09-06 NOTE — Addendum Note (Signed)
Addended by: Raiford Simmonds on: 09/06/2018 02:22 PM   Modules accepted: Orders

## 2018-09-11 ENCOUNTER — Telehealth (HOSPITAL_COMMUNITY): Payer: Self-pay | Admitting: Radiology

## 2018-09-11 NOTE — Telephone Encounter (Signed)
Called to verify cancellation of echocardiogram by phone tree. Unable to leave message the number can not receive calls at this time.

## 2018-09-12 ENCOUNTER — Other Ambulatory Visit (HOSPITAL_COMMUNITY): Payer: 59

## 2018-09-15 ENCOUNTER — Other Ambulatory Visit (HOSPITAL_COMMUNITY): Payer: 59

## 2018-09-15 ENCOUNTER — Telehealth: Payer: 59 | Admitting: Internal Medicine

## 2018-09-15 ENCOUNTER — Ambulatory Visit: Payer: 59 | Admitting: Internal Medicine

## 2018-09-16 ENCOUNTER — Other Ambulatory Visit (HOSPITAL_COMMUNITY)
Admission: RE | Admit: 2018-09-16 | Discharge: 2018-09-16 | Disposition: A | Discharge status: Home / Self Care | Payer: 59 | Source: Ambulatory Visit | Attending: Pulmonary Disease | Admitting: Pulmonary Disease

## 2018-09-16 DIAGNOSIS — Z1159 Encounter for screening for other viral diseases: Secondary | ICD-10-CM | POA: Insufficient documentation

## 2018-09-16 LAB — SARS CORONAVIRUS 2 (TAT 6-24 HRS): SARS Coronavirus 2: NEGATIVE

## 2018-09-19 ENCOUNTER — Ambulatory Visit (HOSPITAL_BASED_OUTPATIENT_CLINIC_OR_DEPARTMENT_OTHER): Payer: 59

## 2018-09-20 ENCOUNTER — Other Ambulatory Visit (HOSPITAL_COMMUNITY): Payer: 59

## 2018-09-20 ENCOUNTER — Telehealth: Payer: Self-pay | Admitting: *Deleted

## 2018-09-20 NOTE — Telephone Encounter (Signed)

## 2018-09-21 ENCOUNTER — Ambulatory Visit (HOSPITAL_BASED_OUTPATIENT_CLINIC_OR_DEPARTMENT_OTHER): Payer: 59 | Attending: Pulmonary Disease | Admitting: Pulmonary Disease

## 2018-09-21 ENCOUNTER — Other Ambulatory Visit: Payer: Self-pay

## 2018-09-21 DIAGNOSIS — G4733 Obstructive sleep apnea (adult) (pediatric): Secondary | ICD-10-CM

## 2018-09-22 ENCOUNTER — Telehealth (HOSPITAL_COMMUNITY): Payer: Self-pay | Admitting: *Deleted

## 2018-09-22 NOTE — Telephone Encounter (Signed)
Close encounter 

## 2018-09-25 ENCOUNTER — Other Ambulatory Visit (HOSPITAL_COMMUNITY): Payer: 59

## 2018-09-26 ENCOUNTER — Ambulatory Visit (HOSPITAL_COMMUNITY)
Admission: RE | Admit: 2018-09-26 | Payer: 59 | Source: Ambulatory Visit | Attending: Internal Medicine | Admitting: Internal Medicine

## 2018-09-26 ENCOUNTER — Encounter (HOSPITAL_COMMUNITY): Payer: 59

## 2018-09-27 ENCOUNTER — Ambulatory Visit (HOSPITAL_COMMUNITY): Payer: 59

## 2018-09-28 ENCOUNTER — Telehealth (INDEPENDENT_AMBULATORY_CARE_PROVIDER_SITE_OTHER): Payer: 59 | Admitting: Internal Medicine

## 2018-09-28 ENCOUNTER — Other Ambulatory Visit: Payer: Self-pay

## 2018-09-28 ENCOUNTER — Telehealth: Payer: Self-pay

## 2018-09-28 NOTE — Telephone Encounter (Signed)
Called patient several times to confirm Office Visit with William Benitez on August 18 at 10:45am. There is no voicemail setup to leave a message

## 2018-09-29 ENCOUNTER — Telehealth (HOSPITAL_COMMUNITY): Payer: Self-pay

## 2018-09-29 NOTE — Telephone Encounter (Signed)
Unable to reach patient. Encounter complete. 

## 2018-09-29 NOTE — Telephone Encounter (Signed)
Follow Up   Patient is returning call. Please give patient a call back at [phone].

## 2018-10-03 ENCOUNTER — Telehealth (HOSPITAL_COMMUNITY): Payer: Self-pay

## 2018-10-03 NOTE — Telephone Encounter (Signed)
Encounter complete. 

## 2018-10-04 ENCOUNTER — Other Ambulatory Visit: Payer: Self-pay

## 2018-10-04 ENCOUNTER — Telehealth: Payer: Self-pay | Admitting: Pulmonary Disease

## 2018-10-04 ENCOUNTER — Ambulatory Visit (HOSPITAL_COMMUNITY)
Admission: RE | Admit: 2018-10-04 | Discharge: 2018-10-04 | Disposition: A | Discharge status: Home / Self Care | Payer: 59 | Source: Ambulatory Visit | Attending: Cardiology | Admitting: Cardiology

## 2018-10-04 DIAGNOSIS — I5032 Chronic diastolic (congestive) heart failure: Secondary | ICD-10-CM | POA: Insufficient documentation

## 2018-10-04 DIAGNOSIS — R0609 Other forms of dyspnea: Secondary | ICD-10-CM | POA: Insufficient documentation

## 2018-10-04 DIAGNOSIS — I10 Essential (primary) hypertension: Secondary | ICD-10-CM | POA: Diagnosis present

## 2018-10-04 DIAGNOSIS — G4733 Obstructive sleep apnea (adult) (pediatric): Secondary | ICD-10-CM

## 2018-10-04 DIAGNOSIS — R079 Chest pain, unspecified: Secondary | ICD-10-CM

## 2018-10-04 MED ORDER — TECHNETIUM TC 99M TETROFOSMIN IV KIT
29.5000 | PACK | Freq: Once | INTRAVENOUS | Status: AC | PRN
  Administered 2018-10-04: 29.5 via INTRAVENOUS
  Filled 2018-10-04: qty 30

## 2018-10-04 MED ORDER — REGADENOSON 0.4 MG/5ML IV SOLN
0.4000 mg | Freq: Once | INTRAVENOUS | Status: AC
  Administered 2018-10-04: 0.4 mg via INTRAVENOUS

## 2018-10-04 MED ORDER — AMINOPHYLLINE 25 MG/ML IV SOLN
100.0000 mg | Freq: Once | INTRAVENOUS | Status: AC
  Administered 2018-10-04: 100 mg via INTRAVENOUS

## 2018-10-04 NOTE — Telephone Encounter (Signed)
Call patient  DME referral  Sleep study result  Severe obstructive sleep apnea  Pressure requirement is high secondary to the patient's body habitus Good control at the high pressures  - Recommend BPAP 20/15 with heated humidification. - Avoid alcohol, sedatives and other CNS depressants that may worsen sleep apnea and disrupt normal sleep architecture. -Optimize sleep hygiene - Aggressive weight management and regular exercise should be initiated or continued. - Sleeping in the semi recumbent position may also help

## 2018-10-04 NOTE — Telephone Encounter (Signed)
I called pt nut there was no answer and no VM to leave message.

## 2018-10-04 NOTE — Progress Notes (Signed)
Patient has not completed cardiovascular testing, appt rescheduled. Please close encounter

## 2018-10-04 NOTE — Procedures (Signed)
POLYSOMNOGRAPHY  Last, First: Levy, Cedano MRN: [id number] Gender: Male Age (years): 48 Weight (lbs): 53 DOB: [birth year] BMI: 51 Primary Care: No PCP Epworth Score: 21 Referring: Laurin Coder MD Technician: Jacolyn Reedy Interpreting: Laurin Coder MD Study Type: Split Night BiPAP Ordered Study Type: Split Night CPAP Study date: 09/21/2018 Location: [facility] CLINICAL INFORMATION Cace Osorto is a 48 year old Male and was referred to the sleep center for evaluation of N/A. Indications include N/A.  MEDICATIONS Patient self administered medications include: N/A. Medications administered during study include No sleep medicine administered.  SLEEP STUDY TECHNIQUE The patient underwent an attended overnight level one polysomnography titration to assess the effects of BIPAP therapy. The following variables were monitored: EEG (C4-A1, C3-A2, O1-A2, O2-A1), EOG, submental and leg EMG, ECG, oxyhemoglobin saturation by pulse oximetry, thoracic and abdominal respiratory effort belts, nasal/oral airflow by pressure sensor, body position sensor and snoring sensor. BIPAP pressure was titrated to eliminate apneas, hypopneas and oxygen desaturation.Hypopneas were scored per AASM definition IB (4% desaturation)  The NPSG portion of the study ended at 12:07:53 PM. The BiPAP titration was initiated at 12:20:23 PM AM with the BIPAP portion of the study ending at 4:15:14 PM.  TECHNICIAN COMMENTS Comments added by Technician: Patient was noticed to snore loudly to very loud. Patient met SPLIT NIGHT protocol due to an AHI of 98.2 with in 2 hrs of TST. CPAP therapy started at 5 cm of H2O, increaed to 7 cm of H2O upon patients' request. Titration was increased to a CPAP Level of 18 cm H2O, then switched to a BILEVEL OF 18/14 cm of H2O due to patients' intolerance of CPAP pressure, although EPR setting at a maxmium of 3. Suboptimal pressure obtained due to events associated with desats and  hyperventilation occuring at a BiLevel of 21/15 cm of H20. that occurred in supine postion only. Tech. asked patient to turn in lateral position and decreasd BiLevel to 18/14 cm of H20 with good control of events. BILEVEL later increased to 20/15 cm of H20 due to snoring Comments added by Scorer: N/A SLEEP ARCHITECTURE The recording time for the entire night was 392.9 minutes.  The diagnostic portion was initiated at 9:42:22 AM and terminated at 12:07:53 PM. The time in bed was 145.5 minutes. EEG confirmed total sleep time was 119.8 minutes yielding a sleep efficiency of 82.3%. Sleep onset after lights out was 0.0 minutes with a REM latency of N/A minutes. The patient spent 39.48% of the night in stage N1 sleep, 60.52 % in stage N2 sleep, 0.00% in stage N3 and 0% in REM. The Arousal Index was 113.2/hour.  The titration portion was initiated at 12:20:23 PM and terminated at 4:15:14 PM. The time in bed was 234.9 minutes. EEG confirmed total sleep time was 221.1 minutes yielding a sleep efficiency of 94.1%. Sleep onset after BiPAP initiation was 13.3 minutes with a REM latency of 36.0 minutes. The patient spent 6.11% of the night in stage N1 sleep, 36.90% in stage N2 sleep, 4.75% in stage N3 and 52.2% in REM. The Arousal Index was 39.1/hour. RESPIRATORY PARAMETERS During the diagnostic portion, there were a total of 224 respiratory disturbances recorded; 196 apneas ( 175 obstructive, 11 mixed, 10 central), 19 hypopneas and 9 RERAs. The apnea/hypopnea index 107.7 was events/hour and the RDI was 112.2 events/hour. The central sleep apnea index was 5.0 events/hour. The REM AHI was N/A/h and NREM AHI was 107.7/h. The REM RDI was 0.0 /h and NREM RDI was 112.2/h. The supine AHI  was 111.9/h, and the non supine AHI was 104.3/h; supine during 45.67% of sleep. The supine RDI was 118.5 /h, and the non supine RDI was 107.08/h. Respiratory disturbances were associated with oxygen desaturation down to a nadir of 85.00%  during sleep. The mean oxygen saturation during the study was 95.34%. The cumulative time under 88% oxygen saturation was 3.5 minutes.  During the titration portion, the apnea/hypopnea index (AHI) was 38.0 events/hour and the RDI was 39.1 events/hour. The central sleep apnea index was 4.9 events/hour. The most appropriate setting of BiPAP was not determined. Respiratory events persisted at all settings. Supplemental oxygen was not administered during the study. LEG MOVEMENT DATA The periodic limb movement index was 0.00/hour with an associated arousal index of /hour. CARDIAC DATA The underlying cardiac rhythm was most consistent with sinus rhythm. Mean heart rate was 90.70 during diagnostic portion and 87.83 during titration portion of study. Additional rhythm abnormalities include None.   IMPRESSIONS - EKG showed no cardiac abnormalities. - The patient snored with loud snoring volume. - Severe Obstructive Sleep apnea(OSA) Sub-Optimal pressure attained, however, patient had an adequate amount of REM sleep on 20/15 with significant improvement in number of events. - No Significant Central Sleep Apnea (CSA) - Moderate Oxygen Desaturation - No significant periodic leg movements(PLMs) during sleep. - Reduced sleep efficiency, short primary sleep latency, no REM sleep latency and no slow wave latency.   DIAGNOSIS - Obstructive Sleep Apnea (327.23 [G47.33 ICD-10])   RECOMMENDATIONS - Recommend BPAP 20/15 with heated humidification. - Avoid alcohol, sedatives and other CNS depressants that may worsen sleep apnea and disrupt normal sleep architecture. - Sleep hygiene should be reviewed to assess factors that may improve sleep quality. - Aggressive weight management and regular exercise should be initiated or continued. - Sleeping in the semi recumbent position may also help  - close clinical follow-up with compliance monitoring.  [Electronically signed] 10/04/2018 06:14 AM  Sherrilyn Rist  MD NPI: [id number]

## 2018-10-05 ENCOUNTER — Ambulatory Visit (HOSPITAL_COMMUNITY)
Admission: RE | Admit: 2018-10-05 | Payer: 59 | Source: Ambulatory Visit | Attending: Internal Medicine | Admitting: Internal Medicine

## 2018-10-06 ENCOUNTER — Ambulatory Visit (HOSPITAL_COMMUNITY): Payer: 59

## 2018-10-06 ENCOUNTER — Other Ambulatory Visit: Payer: Self-pay

## 2018-10-06 ENCOUNTER — Ambulatory Visit (HOSPITAL_COMMUNITY): Payer: 59 | Attending: Cardiology

## 2018-10-06 DIAGNOSIS — G4733 Obstructive sleep apnea (adult) (pediatric): Secondary | ICD-10-CM | POA: Insufficient documentation

## 2018-10-06 DIAGNOSIS — R079 Chest pain, unspecified: Secondary | ICD-10-CM | POA: Diagnosis present

## 2018-10-06 DIAGNOSIS — R0609 Other forms of dyspnea: Secondary | ICD-10-CM | POA: Diagnosis present

## 2018-10-06 NOTE — Telephone Encounter (Signed)
Attempted to call pt but line rang and rang and was never given the option to leave a message. Will try to call back later.

## 2018-10-09 ENCOUNTER — Other Ambulatory Visit: Payer: Self-pay

## 2018-10-09 ENCOUNTER — Ambulatory Visit (HOSPITAL_COMMUNITY): Payer: 59 | Attending: Cardiology

## 2018-10-09 LAB — MYOCARDIAL PERFUSION IMAGING
LV dias vol: 139 mL (ref 62–150)
LV sys vol: 85 mL
Peak HR: 133 {beats}/min
Rest HR: 117 {beats}/min
SDS: 1
SRS: 1
SSS: 1
TID: 1.04

## 2018-10-09 NOTE — Telephone Encounter (Signed)
Patient returned called - he can be reached at [phone] -pr

## 2018-10-09 NOTE — Telephone Encounter (Signed)
Called and spoke to patient.  Gave results per MD.  Patient would like to set up appointment for office visit because he doesn't think he will be able to wear mask.  Scheduled patient for visit on 10/24/2018 per pt request.  Patient did agree for order for DME to be placed in the meantime.  DME order placed.  Nothing further needed at this time.

## 2018-10-09 NOTE — Telephone Encounter (Signed)
Attempted to call pt but pt was currently not at home. Someone did answer the phone and I asked them to have pt call office when he returned home. Will await a return call from pt.

## 2018-10-10 ENCOUNTER — Telehealth: Payer: Self-pay | Admitting: Adult Health

## 2018-10-10 ENCOUNTER — Ambulatory Visit: Payer: 59 | Admitting: Adult Health

## 2018-10-10 NOTE — Telephone Encounter (Signed)
New message   Please call patient with echo and lab results

## 2018-10-10 NOTE — Progress Notes (Deleted)
Virtual Visit via Video Note   This visit type was conducted due to national recommendations for restrictions regarding the COVID-19 Pandemic (e.g. social distancing) in an effort to limit this patient's exposure and mitigate transmission in our community.  Due to his co-morbid illnesses, this patient is at least at moderate risk for complications without adequate follow up.  This format is felt to be most appropriate for this patient at this time.  All issues noted in this document were discussed and addressed.  A limited physical exam was performed with this format.  Please refer to the patient's chart for his consent to telehealth for [facility].  Evaluation Performed:  Follow-up visit  This visit type was conducted due to national recommendations for restrictions regarding the COVID-19 Pandemic (e.g. social distancing).  This format is felt to be most appropriate for this patient at this time.  All issues noted in this document were discussed and addressed.  No physical exam was performed (except for noted visual exam findings with Video Visits).  Please refer to the patient's chart (MyChart message for video visits and phone note for telephone visits) for the patient's consent to telehealth for [facility]  Date:  10/10/2018   ID:  Carollee MassedMarlan Eblen, DOB [birth year], MRN [id number]  Patient Location: [address] [place] [address]   Provider location:   [facility] [address] [place], [place] [address]   PCP:  Pincus SanesBurns, Stacy J, MD  Cardiologist:  Parke PoissonGayatri A Acharya, MD  Electrophysiologist:  None   Chief Complaint: Dyspnea on exertion and chest pain  History of Present Illness:    Carollee MassedMarlan Mollett is a 48 y.o. male who presents via audio/video conferencing for a telehealth visit today.  Patient verified DOB and address.  He was last seen by Dr. Jacques NavyAcharya on 08/01/2018.  During that time he indicated he was dyspneic with minimal exertion activities such  as showering and walk around his house.  He indicated he had bendopnea and chest pain with minimal activity.  Finally he noted that at times he has orthopnea but he was unsure if this was related to his sleep apnea or heart failure.  He had previously been [place] outside [place], indicates that he has had a PCI in the past and is not sure if he has had a stent placed or not. We were not able to access [facility] records.    He had an echocardiogram 10/06/2018 that indicated an LVEF of 55 to 60% with LVH, mild thickening of the aortic valve, and no other abnormal findings.  A myocardial perfusion study on 10/09/2018 showed mild apical thinning, no ST deviation and a low risk study.  His PMH also includes arthritis, asthma, CHF, hypertension,and sleep apnea (currently noncompliant with his CPAP).  He is seen virtually today and states   He denies chest pain, shortness of breath, lower extremity edema, fatigue, palpitations, melena, hematuria, hemoptysis, diaphoresis, weakness, presyncope, syncope, orthopnea, and PND.  The patient does not symptoms concerning for COVID-19 infection (fever, chills, cough, or new SHORTNESS OF BREATH).    Prior CV studies:   The following studies were reviewed today:  EKG 08/01/2018 Normal sinus rhythm 100 bpm  EKG 04/20/2018 Sinus rhythm 96 bpm  Myocardial perfusion study 10/09/2018  Nuclear stress EF: 39%.  There was no ST segment deviation noted during stress.  The study is normal.  This is a low risk study.  The left ventricular ejection  fraction is moderately decreased (30-44%).  No prior study for comparison   Normal perfusion, with only a very mild apical thinning artifact seen. EF is calculated as low, with inferior/inferolateral/inferoseptal mild hypokinesis. However, these walls do appear to thicken on gated images. Recent echo demonstrated normal EF per report, and this may be more clinically accurate.  Echocardiogram  10/06/2018 IMPRESSIONS    1. The left ventricle has normal systolic function, with an ejection fraction of 55-60%. The cavity size was normal. There is mildly increased left ventricular wall thickness. Left ventricular diastolic Doppler parameters are indeterminate.  2. The right ventricle has normal systolic function. The cavity was normal.  3. The aortic valve is tricuspid. Mild thickening of the aortic valve. No stenosis of the aortic valve.  4. The aorta is normal unless otherwise noted.  5. Normal LV function; mild LVH.  Past Medical History:  Diagnosis Date  . Arthritis   . Asthma   . CHF (congestive heart failure) ([place])   . Hypertension   . Sleep apnea    No past surgical history on file.   No outpatient medications have been marked as taking for the 10/10/18 encounter (Appointment) with Lendon Colonel, NP.     Allergies:   Patient has no known allergies.   Social History   Tobacco Use  . Smoking status: Former Research scientist (life sciences)  . Smokeless tobacco: Never Used  Substance Use Topics  . Alcohol use: No    Frequency: Never  . Drug use: Yes    Types: Marijuana    Comment: occ     Family Hx: The patient's family history includes Hypertension in his mother; Kidney disease in his father; Liver disease in his mother.  ROS:   Please see the history of present illness.     All other systems reviewed and are negative.   Labs/Other Tests and Data Reviewed:    Recent Labs: 04/20/2018: B Natriuretic Peptide 60.9 04/24/2018: ALT 20; BUN 10; Creatinine, Ser 0.80; Hemoglobin 13.9; Magnesium 1.7; Platelets 255.0; Potassium 4.2; Sodium 135; TSH 1.86   Recent Lipid Panel Lab Results  Component Value Date/Time   CHOL 262 (H) 04/24/2018 03:19 PM   TRIG 149.0 04/24/2018 03:19 PM   HDL 31.50 (L) 04/24/2018 03:19 PM   CHOLHDL 8 04/24/2018 03:19 PM   LDLCALC 201 (H) 04/24/2018 03:19 PM    Wt Readings from Last 3 Encounters:  10/04/18 (!) 354 lb (160.6 kg)  09/05/18 (!) 354 lb  9.6 oz (160.8 kg)  08/01/18 (!) 357 lb 9.6 oz (162.2 kg)     Exam:    Vital Signs:  There were no vitals taken for this visit.   Well nourished, well developed male in no  acute distress.   ASSESSMENT & PLAN:    1.  Dyspnea on exertion Myoview stress 09/2018 test low risk study.  Echocardiogram 09/2018 LVEF 55 to 60% with LVH, mild thickening of the aortic valve, and no other abnormal findings.Suspect deconditioning in the presence of COVID-19 virus and sedentary lifestyle.  2.  Exertional chest pain- Myoview stress 09/2018 test low risk study.  Echocardiogram 09/2018 LVEF 55 to 60% with LVH, mild thickening of the aortic valve, and no other abnormal findings.   Increase physical activity as tolerated Low-sodium heart healthy diet  3.  Chronic diastolic heart failure echocardiogram 8/14/2020Left ventricular diastolic Doppler parameters indeterminate. Continue carvedilol 25 mg tablet twice daily Continue furosemide 40 mg twice daily Continue losartan 25 mg tablet daily Low-sodium heart healthy diet Daily weights  4.  Essential hypertension- Continue carvedilol 25 mg tablet twice daily Continue furosemide 40 mg twice daily Continue losartan 25 mg tablet daily Low-sodium heart healthy diet Daily weights  COVID-19 Education: The signs and symptoms of COVID-19 were discussed with the patient and how to seek care for testing (follow up with PCP or arrange E-visit).  The importance of social distancing was discussed today.  Patient Risk:   After full review of this patients clinical status, I feel that they are at least moderate risk at this time.  Time:   Today, I have spent *** minutes with the patient with telehealth technology discussing ***.     Medication Adjustments/Labs and Tests Ordered: Current medicines are reviewed at length with the patient today.  Concerns regarding medicines are outlined above.   Tests Ordered: No orders of the defined types were placed in this  encounter.  Medication Changes: No orders of the defined types were placed in this encounter.   Disposition:  {follow up:15908}  Signed, Ronney AstersJesse M Judit Awad, NP  10/10/2018 7:52 AM    [facility]

## 2018-10-10 NOTE — Telephone Encounter (Signed)
Patient made aware of results and verbalized understanding.  Notes recorded by Elouise Munroe, MD on 10/06/2018 at 3:56 PM EDT  Normal biventricular function, mild increase in wall thickness of left ventricle walls. No worrisome findings.

## 2018-10-11 ENCOUNTER — Ambulatory Visit (HOSPITAL_COMMUNITY): Payer: 59

## 2018-10-13 ENCOUNTER — Telehealth: Payer: Self-pay | Admitting: *Deleted

## 2018-10-13 NOTE — Telephone Encounter (Signed)
-----   Message from Buford Dresser, MD sent at 10/12/2018  4:55 PM EDT ----- Normal perfusion. While function here is read as low, this is likely a technical issue. Function was normal on recent echo, and this is a better test. No evidence of blockages.

## 2018-10-13 NOTE — Telephone Encounter (Signed)
NO ANSWER - NO VOICE MAIL   WILL TRY AGAIN  PATIENT ALSO NEEDS TO MAKE A FOLLOW UP APPOINTMENT.

## 2018-10-13 NOTE — Telephone Encounter (Signed)
-----   Message from Bridgette Christopher, MD sent at 10/12/2018  4:55 PM EDT ----- Normal perfusion. While function here is read as low, this is likely a technical issue. Function was normal on recent echo, and this is a better test. No evidence of blockages. 

## 2018-10-24 ENCOUNTER — Ambulatory Visit: Payer: 59 | Admitting: Pulmonary Disease

## 2018-10-25 NOTE — Progress Notes (Deleted)
{Choose 1 Note Type (Telehealth Visit or Telephone Visit):[id number]}   Date:  10/25/2018   ID:  William Benitez Rucinski, DOB 03-15-1970, MRN [id number]  {Patient Location:[phone]::"Home"} {Provider Location:[phone]::"Home"}  PCP:  Pincus SanesBurns, Stacy J, MD  Cardiologist:  Parke PoissonGayatri A Acharya, MD  Electrophysiologist:  None   Evaluation Performed:  {Choose Visit Type:[phone]::"Follow-Up Visit"}  Chief Complaint:  ***  History of Present Illness:    William Benitez Pretty is a 48 y.o. male we are following for ongoing assessment and management of chronic diastolic heart failure, hypertension, with other history to include asthma and sleep apnea, noncompliant with CPAP.    The patient was last seen by Dr Jacques NavyAcharya on 08/01/2018 with complaints of shortness of breath.  It was noted that the patient has a history of chronic lower extremity edema and is being treated with furosemide.  Dr.Acharya reported that the patient has a history of PCI however there was no records to confirm anatomy or year where PCI was placed.  The patient is also not compliant with a low-sodium diet or daily weights.  On last office visit the patient was scheduled for an echocardiogram and a Myoview stress test.  Stress test was completed on 10/09/2018 with a EF of 39% considered a low risk study with no evidence of ischemia.  Echocardiogram dated 10/06/2018 revealed an EF of 55% to 60%.  Doppler parameters were indeterminate.  He had normal LV function with mild LVH.  The patient {does/does not:200015} have symptoms concerning for COVID-19 infection (fever, chills, cough, or new shortness of breath).    Past Medical History:  Diagnosis Date  . Arthritis   . Asthma   . CHF (congestive heart failure) (HCC)   . Hypertension   . Sleep apnea    No past surgical history on file.   No outpatient medications have been marked as taking for the 10/26/18 encounter (Appointment) with Jodelle GrossLawrence, Nyela Cortinas M, NP.     Allergies:   Patient has no known  allergies.   Social History   Tobacco Use  . Smoking status: Former Games developermoker  . Smokeless tobacco: Never Used  Substance Use Topics  . Alcohol use: No    Frequency: Never  . Drug use: Yes    Types: Marijuana    Comment: occ     Family Hx: The patient's family history includes Hypertension in his mother; Kidney disease in his father; Liver disease in his mother.  ROS:   Please see the history of present illness.    *** All other systems reviewed and are negative.   Prior CV studies:   The following studies were reviewed today: Echocardiogram 10/06/2018 1. The left ventricle has normal systolic function, with an ejection fraction of 55-60%. The cavity size was normal. There is mildly increased left ventricular wall thickness. Left ventricular diastolic Doppler parameters are indeterminate.  2. The right ventricle has normal systolic function. The cavity was normal.  3. The aortic valve is tricuspid. Mild thickening of the aortic valve. No stenosis of the aortic valve.  4. The aorta is normal unless otherwise noted.  5. Normal LV function; mild LVH.  NM stress test 10/06/2018  Nuclear stress EF: 39%.  There was no ST segment deviation noted during stress.  The study is normal.  This is a low risk study.  The left ventricular ejection fraction is moderately decreased (30-44%).  No prior study for comparison  Labs/Other Tests and Data Reviewed:    EKG:  {EKG/Telemetry Strips Reviewed:[id number]}  Recent Labs: 04/20/2018: B Natriuretic  Peptide 60.9 04/24/2018: ALT 20; BUN 10; Creatinine, Ser 0.80; Hemoglobin 13.9; Magnesium 1.7; Platelets 255.0; Potassium 4.2; Sodium 135; TSH 1.86   Recent Lipid Panel Lab Results  Component Value Date/Time   CHOL 262 (H) 04/24/2018 03:19 PM   TRIG 149.0 04/24/2018 03:19 PM   HDL 31.50 (L) 04/24/2018 03:19 PM   CHOLHDL 8 04/24/2018 03:19 PM   LDLCALC 201 (H) 04/24/2018 03:19 PM    Wt Readings from Last 3 Encounters:  10/04/18 (!)  354 lb (160.6 kg)  09/05/18 (!) 354 lb 9.6 oz (160.8 kg)  08/01/18 (!) 357 lb 9.6 oz (162.2 kg)     Objective:    Vital Signs:  There were no vitals taken for this visit.   {HeartCare Virtual Exam (Optional):[phone]::"VITAL SIGNS:  reviewed"}  ASSESSMENT & PLAN:    1. ***  COVID-19 Education: The signs and symptoms of COVID-19 were discussed with the patient and how to seek care for testing (follow up with PCP or arrange E-visit).  ***The importance of social distancing was discussed today.  Time:   Today, I have spent *** minutes with the patient with telehealth technology discussing the above problems.     Medication Adjustments/Labs and Tests Ordered: Current medicines are reviewed at length with the patient today.  Concerns regarding medicines are outlined above.   Tests Ordered: No orders of the defined types were placed in this encounter.   Medication Changes: No orders of the defined types were placed in this encounter.   Disposition:  Follow up {follow up:15908}  Signed, Phill Myron. West Pugh, ANP, AACC  10/25/2018 10:29 AM    [facility]

## 2018-10-26 ENCOUNTER — Telehealth: Payer: 59 | Admitting: Adult Health

## 2018-10-26 NOTE — Telephone Encounter (Signed)
Attempted to contact patient to advise of ECHO results- no answer, no voicemail. Will route to Primary.  Thanks!

## 2018-10-26 NOTE — Telephone Encounter (Signed)
Called patient, he states he would like to know if the stress test showed if he has diastolic heart failure, since that is what he is out on disability for? Advised that I could only read the resulted study which was normal perfusion, but I would route a message to see if we could assist him in this. Thank you!

## 2018-10-26 NOTE — Telephone Encounter (Signed)
Follow Up:        Pt says he was returning a call, concerning his Stress test results.re

## 2018-10-27 NOTE — Telephone Encounter (Signed)
appt today with K.Lawrence,DNP - DISCUSS RESULTS 10/26/18

## 2018-11-14 ENCOUNTER — Telehealth: Payer: Self-pay | Admitting: Pulmonary Disease

## 2018-11-15 NOTE — Telephone Encounter (Signed)
Called and spoke to pt family member advised to have pt call the office back -pr

## 2018-11-16 NOTE — Telephone Encounter (Signed)
Attempted to call pt to schedule pft and f/u with AO - no answer and no vm-pr

## 2018-12-19 NOTE — Progress Notes (Signed)
Subjective:    Patient ID: William Benitez, male    DOB: 05-Jul-1970, 48 y.o.   MRN: [id number]  HPI The patient is here for follow up.      Medications and allergies reviewed with patient and updated if appropriate.  Patient Active Problem List   Diagnosis Date Noted  . Cellulitis of foot, left 08/29/2018  . Infected cyst of skin 07/04/2018  . Type 2 diabetes mellitus without complication (HCC) 04/26/2018  . SOB (shortness of breath) 04/24/2018  . OSA (obstructive sleep apnea), severe 08/2018 04/24/2018  . Bilateral primary osteoarthritis of knee 04/24/2018  . Muscle cramping 04/24/2018  . GERD (gastroesophageal reflux disease) 04/24/2018  . Epigastric pain 04/24/2018  . Black stool 04/24/2018  . Asthma 04/23/2018  . Hyperlipidemia 04/23/2018  . Chronic diastolic heart failure (HCC)   . Hypertension     Current Outpatient Medications on File Prior to Visit  Medication Sig Dispense Refill  . albuterol (PROVENTIL HFA;VENTOLIN HFA) 108 (90 Base) MCG/ACT inhaler Inhale 1-2 puffs into the lungs every 6 (six) hours as needed for wheezing or shortness of breath. 1 Inhaler 5  . carvedilol (COREG) 25 MG tablet Take 1 tablet (25 mg total) by mouth 2 (two) times daily with a meal. 60 tablet 3  . ciclesonide (ALVESCO) 160 MCG/ACT inhaler Inhale 1 puff into the lungs 2 (two) times daily.    . furosemide (LASIX) 40 MG tablet Take 1 tablet (40 mg total) by mouth 2 (two) times daily. 60 tablet 0  . losartan (COZAAR) 25 MG tablet Take 1 tablet (25 mg total) by mouth daily. 90 tablet 2  . metFORMIN (GLUCOPHAGE) 500 MG tablet Take 1 tablet (500 mg total) by mouth 2 (two) times daily with a meal. 180 tablet 3  . pantoprazole (PROTONIX) 40 MG tablet Take 1 tablet (40 mg total) by mouth daily. 30 tablet 5  . rosuvastatin (CRESTOR) 40 MG tablet Take 1 tablet (40 mg total) by mouth daily. 90 tablet 3  . sulfamethoxazole-trimethoprim (BACTRIM DS) 800-160 MG tablet Take 1 tablet by mouth 2 (two) times  daily. 14 tablet 0   No current facility-administered medications on file prior to visit.     Past Medical History:  Diagnosis Date  . Arthritis   . Asthma   . CHF (congestive heart failure) (HCC)   . Hypertension   . Sleep apnea     No past surgical history on file.  Social History   Socioeconomic History  . Marital status: Married    Spouse name: Not on file  . Number of children: Not on file  . Years of education: Not on file  . Highest education level: Not on file  Occupational History  . Not on file  Social Needs  . Financial resource strain: Not on file  . Food insecurity    Worry: Not on file    Inability: Not on file  . Transportation needs    Medical: Not on file    Non-medical: Not on file  Tobacco Use  . Smoking status: Former Games developer  . Smokeless tobacco: Never Used  Substance and Sexual Activity  . Alcohol use: No    Frequency: Never  . Drug use: Yes    Types: Marijuana    Comment: occ  . Sexual activity: Not on file  Lifestyle  . Physical activity    Days per week: Not on file    Minutes per session: Not on file  . Stress: Not on file  Relationships  . Social Herbalist on phone: Not on file    Gets together: Not on file    Attends religious service: Not on file    Active member of club or organization: Not on file    Attends meetings of clubs or organizations: Not on file    Relationship status: Not on file  Other Topics Concern  . Not on file  Social History Narrative  . Not on file    Family History  Problem Relation Age of Onset  . Hypertension Mother   . Liver disease Mother   . Kidney disease Father     Review of Systems     Objective:  There were no vitals filed for this visit. BP Readings from Last 3 Encounters:  09/05/18 140/80  08/01/18 121/75  05/31/18 (!) 164/88   Wt Readings from Last 3 Encounters:  10/04/18 (!) 354 lb (160.6 kg)  09/05/18 (!) 354 lb 9.6 oz (160.8 kg)  08/01/18 (!) 357 lb 9.6 oz  (162.2 kg)   There is no height or weight on file to calculate BMI.   Physical Exam         Assessment & Plan:    See Problem List for Assessment and Plan of chronic medical problems.   This encounter was created in error - please disregard.

## 2018-12-19 NOTE — Patient Instructions (Signed)

## 2018-12-20 ENCOUNTER — Encounter: Payer: Self-pay | Admitting: Internal Medicine

## 2018-12-20 ENCOUNTER — Telehealth: Payer: Self-pay

## 2018-12-20 ENCOUNTER — Ambulatory Visit: Payer: 59 | Admitting: Internal Medicine

## 2018-12-20 ENCOUNTER — Encounter: Payer: 59 | Admitting: Internal Medicine

## 2018-12-20 NOTE — Telephone Encounter (Signed)
Copied from [place] [id number]. Topic: General - Other >> Dec 20, 2018  3:06 PM [name] A wrote: Reason for CRM: William Benitez, from the [facility], called and is requesting to know if the paper work she has faxed over on 11/24/2018, 12/08/2018, 12/20/2018 has been received. Pt has an appt tomorrow and she is needing the paperwork filled out. Please advise.   [phone]

## 2018-12-20 NOTE — Telephone Encounter (Signed)
We received paperwork and I faxed back with a cover sheet on it stating Dr. Quay Burow will not fill these papers out for patient. She does not see filling out these forms appropriate for patients condition. Letter resent.

## 2018-12-20 NOTE — Progress Notes (Signed)
Subjective:    Patient ID: William Benitez, male    DOB: January 09, 1971, 48 y.o.   MRN: [id number]  HPI The patient is here for follow up.   He is not exercising regularly.    He continues to have dyspnea on exertion.  There has been no improvement.   OSA, severe:  His sleep apnea test in July showed severe sleep apnea.  He has not tolerated CPAP and has a follow-up with pulmonary to discuss other options.  He is experiencing shortness of breath with exertion, regular headaches, fatigue and unrefreshing sleep.  Diabetes: He is taking his medication daily as prescribed. He is somewhat compliant with a diabetic diet.  He is interested in getting back to seeing a nutritionist to help with his diet.  He is trying to lose weight, but has not been too successful.  Hypertension: He is taking his medication daily. He is compliant with a low sodium diet.  He denies chest pain, palpitations, edema.  He does have his shortness of breath and regular headaches.  Hyperlipidemia: He is taking his medication daily. He is somewhat compliant with a low fat/cholesterol diet. He denies myalgias.   GERD:  He is taking his medication daily as prescribed.  He denies any GERD symptoms and feels his GERD is well controlled.     Medications and allergies reviewed with patient and updated if appropriate.  Patient Active Problem List   Diagnosis Date Noted  . Tingling of both feet 12/21/2018  . Cellulitis of foot, left 08/29/2018  . Infected cyst of skin 07/04/2018  . Type 2 diabetes mellitus without complication (HCC) 04/26/2018  . SOB (shortness of breath) 04/24/2018  . OSA (obstructive sleep apnea), severe 08/2018 04/24/2018  . Bilateral primary osteoarthritis of knee 04/24/2018  . Muscle cramping 04/24/2018  . GERD (gastroesophageal reflux disease) 04/24/2018  . Epigastric pain 04/24/2018  . Black stool 04/24/2018  . Asthma 04/23/2018  . Hyperlipidemia 04/23/2018  . Chronic diastolic heart failure  (HCC)   . Hypertension     Current Outpatient Medications on File Prior to Visit  Medication Sig Dispense Refill  . albuterol (PROVENTIL HFA;VENTOLIN HFA) 108 (90 Base) MCG/ACT inhaler Inhale 1-2 puffs into the lungs every 6 (six) hours as needed for wheezing or shortness of breath. 1 Inhaler 5  . carvedilol (COREG) 25 MG tablet Take 1 tablet (25 mg total) by mouth 2 (two) times daily with a meal. 60 tablet 3  . ciclesonide (ALVESCO) 160 MCG/ACT inhaler Inhale 1 puff into the lungs 2 (two) times daily.    . furosemide (LASIX) 40 MG tablet Take 1 tablet (40 mg total) by mouth 2 (two) times daily. 60 tablet 0  . ibuprofen (ADVIL) 800 MG tablet Take 800 mg by mouth 3 (three) times daily.    . metFORMIN (GLUCOPHAGE) 500 MG tablet Take 1 tablet (500 mg total) by mouth 2 (two) times daily with a meal. 180 tablet 3  . pantoprazole (PROTONIX) 40 MG tablet Take 1 tablet (40 mg total) by mouth daily. 30 tablet 5  . rosuvastatin (CRESTOR) 40 MG tablet Take 1 tablet (40 mg total) by mouth daily. 90 tablet 3  . losartan (COZAAR) 25 MG tablet Take 1 tablet (25 mg total) by mouth daily. 90 tablet 2   No current facility-administered medications on file prior to visit.     Past Medical History:  Diagnosis Date  . Arthritis   . Asthma   . CHF (congestive heart failure) (HCC)   .  Hypertension   . Sleep apnea     History reviewed. No pertinent surgical history.  Social History   Socioeconomic History  . Marital status: Married    Spouse name: Not on file  . Number of children: Not on file  . Years of education: Not on file  . Highest education level: Not on file  Occupational History  . Not on file  Social Needs  . Financial resource strain: Not on file  . Food insecurity    Worry: Not on file    Inability: Not on file  . Transportation needs    Medical: Not on file    Non-medical: Not on file  Tobacco Use  . Smoking status: Former Research scientist (life sciences)  . Smokeless tobacco: Never Used  Substance  and Sexual Activity  . Alcohol use: No    Frequency: Never  . Drug use: Yes    Types: Marijuana    Comment: occ  . Sexual activity: Not on file  Lifestyle  . Physical activity    Days per week: Not on file    Minutes per session: Not on file  . Stress: Not on file  Relationships  . Social Herbalist on phone: Not on file    Gets together: Not on file    Attends religious service: Not on file    Active member of club or organization: Not on file    Attends meetings of clubs or organizations: Not on file    Relationship status: Not on file  Other Topics Concern  . Not on file  Social History Narrative  . Not on file    Family History  Problem Relation Age of Onset  . Hypertension Mother   . Liver disease Mother   . Kidney disease Father     Review of Systems  Constitutional: Negative for chills and fever.  Respiratory: Positive for shortness of breath (with exertion). Negative for cough and wheezing.   Cardiovascular: Positive for palpitations. Negative for chest pain and leg swelling.  Neurological: Positive for headaches. Negative for light-headedness.       Objective:   Vitals:   12/21/18 1020  BP: 132/84  Pulse: (!) 110  Temp: 98.4 F (36.9 C)  SpO2: 95%   BP Readings from Last 3 Encounters:  12/21/18 132/84  09/05/18 140/80  08/01/18 121/75   Wt Readings from Last 3 Encounters:  12/21/18 (!) 348 lb 12.8 oz (158.2 kg)  10/04/18 (!) 354 lb (160.6 kg)  09/05/18 (!) 354 lb 9.6 oz (160.8 kg)   Body mass index is 51.51 kg/m.   Physical Exam    Constitutional: Appears well-developed and well-nourished. No distress.  HENT:  Head: Normocephalic and atraumatic.  Neck: Neck supple. No tracheal deviation present. No thyromegaly present.  No cervical lymphadenopathy Cardiovascular: Normal rate, regular rhythm and normal heart sounds.   No murmur heard. No carotid bruit .  No edema Pulmonary/Chest: Effort normal and breath sounds normal. No  respiratory distress. No has no wheezes. No rales.  Skin: Skin is warm and dry. Not diaphoretic.  Psychiatric: Normal mood and affect. Behavior is normal.      Assessment & Plan:    See Problem List for Assessment and Plan of chronic medical problems.

## 2018-12-21 ENCOUNTER — Other Ambulatory Visit: Payer: Self-pay

## 2018-12-21 ENCOUNTER — Encounter: Payer: Self-pay | Admitting: Internal Medicine

## 2018-12-21 ENCOUNTER — Ambulatory Visit (INDEPENDENT_AMBULATORY_CARE_PROVIDER_SITE_OTHER): Payer: 59 | Admitting: Internal Medicine

## 2018-12-21 VITALS — BP 132/84 | HR 110 | Temp 98.4°F | Ht 69.0 in | Wt 348.8 lb

## 2018-12-21 DIAGNOSIS — K219 Gastro-esophageal reflux disease without esophagitis: Secondary | ICD-10-CM | POA: Diagnosis not present

## 2018-12-21 DIAGNOSIS — E119 Type 2 diabetes mellitus without complications: Secondary | ICD-10-CM

## 2018-12-21 DIAGNOSIS — G4733 Obstructive sleep apnea (adult) (pediatric): Secondary | ICD-10-CM | POA: Diagnosis not present

## 2018-12-21 DIAGNOSIS — Z23 Encounter for immunization: Secondary | ICD-10-CM

## 2018-12-21 DIAGNOSIS — I10 Essential (primary) hypertension: Secondary | ICD-10-CM

## 2018-12-21 DIAGNOSIS — R202 Paresthesia of skin: Secondary | ICD-10-CM | POA: Insufficient documentation

## 2018-12-21 DIAGNOSIS — E782 Mixed hyperlipidemia: Secondary | ICD-10-CM

## 2018-12-21 MED ORDER — BLOOD GLUCOSE MONITOR KIT: PACK | 0 refills | Status: AC

## 2018-12-21 MED ORDER — OZEMPIC (0.25 OR 0.5 MG/DOSE) 2 MG/1.5ML ~~LOC~~ SOPN: PEN_INJECTOR | SUBCUTANEOUS | 5 refills | Status: AC

## 2018-12-21 NOTE — Assessment & Plan Note (Signed)
GERD controlled Continue daily medication  

## 2018-12-21 NOTE — Assessment & Plan Note (Signed)
In toes and pinky finger on left Will check B12 level

## 2018-12-21 NOTE — Assessment & Plan Note (Signed)
Did not tolerate CPAP Has follow-up with pulmonary to discuss other options Reviewed consequences of uncontrolled severe sleep apnea and the importance of treating this

## 2018-12-21 NOTE — Assessment & Plan Note (Signed)
Check lipid panel  Continue daily statin Regular exercise and healthy diet encouraged  

## 2018-12-21 NOTE — Patient Instructions (Addendum)
Call cardiology -- Phone: [phone]   Tests ordered today. Your results will be released to [place] (or called to you) after review.  If any changes need to be made, you will be notified at that same time.   Tetanus and flu immunization administered today.   Medications reviewed and updated.  Changes include :   Start ozempic once a week.  If this is not covered please let us know.   Your prescription(s) have been submitted to your pharmacy. Please take as directed and contact our office if you believe you are having problem(s) with the medication(s).   Please followup in 4 months

## 2018-12-21 NOTE — Assessment & Plan Note (Signed)
BP well controlled Current regimen effective and well tolerated Continue current medications at current doses CBC, CMP 

## 2018-12-21 NOTE — Assessment & Plan Note (Addendum)
Has been taking Metformin as prescribed Trying to do better with his diet, but will get try to get back to see the nutritionist Check A1c, urine microalbumin Would benefit significantly from regular exercise and weight loss, which we discussed at length We will try Ozempic-discussed that if tolerated and approved by his insurance this should help with his weight loss efforts Continue Metformin Follow-up in 4 months

## 2018-12-22 NOTE — Addendum Note (Signed)
Addended by: Marcina Millard on: 12/22/2018 09:45 AM   Modules accepted: Orders

## 2018-12-25 NOTE — Progress Notes (Signed)
Virtual Visit via Telephone Note   This visit type was conducted due to national recommendations for restrictions regarding the COVID-19 Pandemic (e.g. social distancing) in an effort to limit this patient's exposure and mitigate transmission in our community.  Due to his co-morbid illnesses, this patient is at least at moderate risk for complications without adequate follow up.  This format is felt to be most appropriate for this patient at this time.  The patient did not have access to video technology/had technical difficulties with video requiring transitioning to audio format only (telephone).  All issues noted in this document were discussed and addressed.  No physical exam could be performed with this format.  Please refer to the patient's chart for his  consent to telehealth for [facility].   Date:  12/25/2018   ID:  William Benitez, DOB 09/11/1970, MRN [id number]  Patient Location: Home Provider Location: Home  PCP:  Binnie Rail, MD  Cardiologist:  Elouise Munroe, MD  Electrophysiologist:  None   Evaluation Performed:  Follow-Up Visit  Chief Complaint:  Dyspnea and hypertension  History of Present Illness:    William Benitez is a 48 y.o. male for ongoing assessment and management of hypertension, acute diastolic CHF, with history of OSA and asthma. Non-compliant with CPAP.   He  was last seen by Dr.Acharya on 08/01/2018 with worsening DOE, and chest pressure. He was scheduled for a echocardiogram and stress test. He was to take 81 mg of ASA daily.  He was also referred for a sleep study. He was noted to to have a headache on lisinopril and therefore was started on losartan.   Echocardiogram on 10/06/2018 demonstrated normal LV fx with EF of 55%-60%, with mild LVH. Stress myoview dated 10/09/2018, which was found to be normal and a low risk study. Normal perfusion.   He completed his sleep study in August 2020, and was found to have severe OSA. He is to see pulmonary, Dr.  Ander Slade,  on Jan 01, 2019 for follow up and plans for alternative CPAP device as he did not tolerate CPAP on trial.   He is medically compliant, BP has been stable. He still struggles with salt intake, but is working on it. PCP is completing labs.   He continues to have DOE, PND and orthopnea, along with bendopenia. He has been on disability as well due to his diagnosis.   The patient does not have symptoms concerning for COVID-19 infection (fever, chills, cough, or new shortness of breath).    Past Medical History:  Diagnosis Date  . Arthritis   . Asthma   . CHF (congestive heart failure) ([place])   . Hypertension   . Sleep apnea    No past surgical history on file.   No outpatient medications have been marked as taking for the 12/26/18 encounter (Appointment) with Lendon Colonel, NP.     Allergies:   Patient has no known allergies.   Social History   Tobacco Use  . Smoking status: Former Research scientist (life sciences)  . Smokeless tobacco: Never Used  Substance Use Topics  . Alcohol use: No    Frequency: Never  . Drug use: Yes    Types: Marijuana    Comment: occ     Family Hx: The patient's family history includes Hypertension in his mother; Kidney disease in his father; Liver disease in his mother.  ROS:   Please see the history of present illness.    All other systems reviewed and are negative.  Prior CV studies:   The following studies were reviewed today: Echocardiogram 10/06/2018 1. The left ventricle has normal systolic function, with an ejection fraction of 55-60%. The cavity size was normal. There is mildly increased left ventricular wall thickness. Left ventricular diastolic Doppler parameters are indeterminate.  2. The right ventricle has normal systolic function. The cavity was normal.  3. The aortic valve is tricuspid. Mild thickening of the aortic valve. No stenosis of the aortic valve.  4. The aorta is normal unless otherwise noted.  5. Normal LV function; mild LVH.   Labs/Other Tests and Data Reviewed:    EKG:  No ECG reviewed.  Recent Labs: 04/20/2018: B Natriuretic Peptide 60.9 04/24/2018: ALT 20; BUN 10; Creatinine, Ser 0.80; Hemoglobin 13.9; Magnesium 1.7; Platelets 255.0; Potassium 4.2; Sodium 135; TSH 1.86   Recent Lipid Panel Lab Results  Component Value Date/Time   CHOL 262 (H) 04/24/2018 03:19 PM   TRIG 149.0 04/24/2018 03:19 PM   HDL 31.50 (L) 04/24/2018 03:19 PM   CHOLHDL 8 04/24/2018 03:19 PM   LDLCALC 201 (H) 04/24/2018 03:19 PM    Wt Readings from Last 3 Encounters:  12/21/18 (!) 348 lb 12.8 oz (158.2 kg)  10/04/18 (!) 354 lb (160.6 kg)  09/05/18 (!) 354 lb 9.6 oz (160.8 kg)     Objective:    Vital Signs:  There were no vitals taken for this visit.   VITAL SIGNS:  reviewed GEN:  no acute distress NEURO:  alert and oriented x 3, no obvious focal deficit PSYCH:  normal affect  ASSESSMENT & PLAN:    1. Hypertension: Multifactorial in the setting of OSA, obesity, and dietary intake of salt. He remains on coreg 25 mg BID, losartan 25 mg daily, and lasix 40 mg daily. BP has been controlled per recent visit with PCP, Dr. Lawerance Bach at 132/84. No changes in his regimen.   2. Severe OSA: Not tolerant of CPAP. He is to see his pulmonologist Dr. Wynona Neat, on January 01, 2019 for discussion of alternatives to CPAP. If he begins a OSA regimen will need close follow up to make sure BP is stable and not hypotensive.  3. Morbid Obesity: Consider referral to Bariatric center for assistance in weight loss.    COVID-19 Education: The signs and symptoms of COVID-19 were discussed with the patient and how to seek care for testing (follow up with PCP or arrange E-visit).  The importance of social distancing was discussed today.  Time:   Today, I have spent 20 minutes with the patient with telehealth technology discussing the above problems.     Medication Adjustments/Labs and Tests Ordered: Current medicines are reviewed at length with the  patient today.  Concerns regarding medicines are outlined above.   Tests Ordered: No orders of the defined types were placed in this encounter.   Medication Changes: No orders of the defined types were placed in this encounter.   Disposition:  Follow up  3 months with Dr. Jacques Navy   Signed, Bettey Mare. Liborio Nixon, ANP, AACC  12/25/2018 1:15 PM    [facility]

## 2018-12-26 ENCOUNTER — Encounter: Payer: Self-pay | Admitting: Adult Health

## 2018-12-26 ENCOUNTER — Telehealth (INDEPENDENT_AMBULATORY_CARE_PROVIDER_SITE_OTHER): Payer: 59 | Admitting: Adult Health

## 2018-12-26 VITALS — Ht 69.0 in | Wt 348.0 lb

## 2018-12-26 DIAGNOSIS — G4733 Obstructive sleep apnea (adult) (pediatric): Secondary | ICD-10-CM

## 2018-12-26 DIAGNOSIS — I10 Essential (primary) hypertension: Secondary | ICD-10-CM | POA: Diagnosis not present

## 2018-12-26 DIAGNOSIS — R0609 Other forms of dyspnea: Secondary | ICD-10-CM

## 2018-12-26 NOTE — Patient Instructions (Signed)
Medication Instructions:  Continue current medications  *If you need a refill on your cardiac medications before your next appointment, please call your pharmacy*  Lab Work: None Ordered  Testing/Procedures: None Ordered  Follow-Up: At [facility], you and your health needs are our priority.  As part of our continuing mission to provide you with exceptional heart care, we have created designated Provider Care Teams.  These Care Teams include your primary Cardiologist (physician) and Advanced Practice Providers (APPs -  Physician Assistants and Nurse Practitioners) who all work together to provide you with the care you need, when you need it.  Your next appointment:   2 Months  The format for your next appointment:   In Person  Provider:   Cherlynn Kaiser, MD

## 2019-01-01 ENCOUNTER — Ambulatory Visit (INDEPENDENT_AMBULATORY_CARE_PROVIDER_SITE_OTHER): Payer: 59 | Admitting: Pulmonary Disease

## 2019-01-01 ENCOUNTER — Other Ambulatory Visit: Payer: Self-pay

## 2019-01-01 ENCOUNTER — Encounter: Payer: Self-pay | Admitting: Pulmonary Disease

## 2019-01-01 VITALS — BP 142/78 | HR 106 | Ht 69.0 in | Wt 343.8 lb

## 2019-01-01 DIAGNOSIS — G4733 Obstructive sleep apnea (adult) (pediatric): Secondary | ICD-10-CM | POA: Diagnosis not present

## 2019-01-01 NOTE — Patient Instructions (Signed)
Severe obstructive sleep apnea We will not be able to tolerate BiPAP  We will refer you for evaluation for an inspire device As we talked about we will check your oxygen levels to start oxygen supplementation at night -I do not believe this helps the symptoms you have from sleep disordered breathing -This also does not treat obstructive sleep apnea  Risk of not treating the sleep disordered breathing as discussed  I will see you back in the office in about 3 months  Continue aggressive weight loss efforts

## 2019-01-01 NOTE — Progress Notes (Signed)
Subjective:    Patient ID: William Benitez, male    DOB: 13-Feb-1971, 48 y.o.   MRN: [id number]  Patient being seen for obstructive sleep  He was diagnosed with obstructive sleep apnea about 2 years ago Only used CPAP for 1 night and said he could not tolerated The mask did not fit very well  He did have a repeat study showing severe obstructive sleep apnea with an AHI of 107 Titrated to BiPAP  Is currently not using pressure therapy He does not think he will tolerate BiPAP  Currently focusing on weight loss efforts He recently had discussions with his primary doctor about use of supplemental oxygen for his sleep apnea  I did discuss extensively how oxygen supplementation will not help his obstructive sleep apnea, the risk of not treating his sleep apnea  He had questions about an inspire device-I do not believe is a good candidate because of his morbid obesity However we agreed to have him evaluated  He is sleepy all the time Wakes up like his not had a good night rest Disorganized sleep habits Will finally fall asleep about 6 AM Able to sleep until late 11-12 Can fall asleep easily during the day  Admits to snoring  He has history of congestive heart failure, history of diabetes   He is short of breath with most activities   Review of Systems  Constitutional: Negative for fever and unexpected weight change.  HENT: Negative for congestion, dental problem, ear pain, nosebleeds, postnasal drip, rhinorrhea, sinus pressure, sneezing, sore throat and trouble swallowing.   Eyes: Negative for redness and itching.  Respiratory: Positive for shortness of breath. Negative for chest tightness and wheezing.   Cardiovascular: Positive for leg swelling. Negative for palpitations.  Gastrointestinal: Negative for nausea and vomiting.  Skin: Negative for rash.  Allergic/Immunologic: Negative.   All other systems reviewed and are negative.  Past Medical History:  Diagnosis Date  .  Arthritis   . Asthma   . CHF (congestive heart failure) (HCC)   . Hypertension   . Sleep apnea    Social History   Socioeconomic History  . Marital status: Married    Spouse name: Not on file  . Number of children: Not on file  . Years of education: Not on file  . Highest education level: Not on file  Occupational History  . Not on file  Social Needs  . Financial resource strain: Not on file  . Food insecurity    Worry: Not on file    Inability: Not on file  . Transportation needs    Medical: Not on file    Non-medical: Not on file  Tobacco Use  . Smoking status: Former Games developer  . Smokeless tobacco: Never Used  Substance and Sexual Activity  . Alcohol use: No    Frequency: Never  . Drug use: Yes    Types: Marijuana    Comment: occ  . Sexual activity: Not on file  Lifestyle  . Physical activity    Days per week: Not on file    Minutes per session: Not on file  . Stress: Not on file  Relationships  . Social Musician on phone: Not on file    Gets together: Not on file    Attends religious service: Not on file    Active member of club or organization: Not on file    Attends meetings of clubs or organizations: Not on file    Relationship status: Not  on file  . Intimate partner violence    Fear of current or ex partner: Not on file    Emotionally abused: Not on file    Physically abused: Not on file    Forced sexual activity: Not on file  Other Topics Concern  . Not on file  Social History Narrative  . Not on file   Family History  Problem Relation Age of Onset  . Hypertension Mother   . Liver disease Mother   . Kidney disease Father       Objective:   Physical Exam Constitutional:      Appearance: Normal appearance.     Comments: Morbidly obese  HENT:     Head: Normocephalic and atraumatic.  Neck:     Musculoskeletal: Normal range of motion and neck supple. No neck rigidity or muscular tenderness.  Cardiovascular:     Rate and Rhythm:  Normal rate and regular rhythm.  Pulmonary:     Effort: Pulmonary effort is normal.     Comments: Decreased air entry bilaterally secondary to body habitus Abdominal:     Palpations: There is no mass.     Tenderness: There is no abdominal tenderness.  Musculoskeletal: Normal range of motion.  Neurological:     Mental Status: He is alert.    Vitals:   01/01/19 1044  BP: (!) 142/78  Pulse: (!) 106  SpO2: 99%    Results of the Epworth flowsheet 01/01/2019 09/05/2018  Sitting and reading 3 3  Watching TV 3 3  Sitting, inactive in a public place (e.g. a theatre or a meeting) 3 3  As a passenger in a car for an hour without a break 3 3  Lying down to rest in the afternoon when circumstances permit 3 3  Sitting and talking to someone 3 3  Sitting quietly after a lunch without alcohol 3 3  In a car, while stopped for a few minutes in traffic 3 2  Total score 24 23      Assessment & Plan:  .  Obstructive sleep apnea-untreated at present -Severe obstructive sleep apnea with AHI of 107 -Titrated to BiPAP -Has not started treatment and does not feel it will be able to tolerate BiPAP  .  Morbid obesity -He is focusing on weight loss at present -States he is getting more physically active -He is receiving shots to help with weight loss  .  History of heart failure. -Compensated at present  .  History of diabetes -Compliant with treatment  .  Severe excessive daytime sleepiness -Likely related to inadequate sleep, nonrestorative sleep and severe obstructive sleep apnea days untreated at present  Consequences of inadequate treatment of sleep disordered breathing was discussed including cardiac risk, driving risk, risk to his health in general Importance of weight control discussed  Options of treatment discussed  I did advise the patient that he needs to have the obstructive sleep apnea treated to reduce his risk of harm He needs to work on losing weight  We will make  referral to ENT for evaluation for an inspire device   We will obtain an overnight oximetry to ascertain oxygen requirement -I made it clear to him that this is not an acceptable option for treating his obstructive sleep apnea, this does not treat obstructive sleep apnea  I will see him back in 3 months  Risk to his health is very high with untreated sleep disordered breathing Risk of harm with daytime sleepiness discussed

## 2019-01-10 ENCOUNTER — Telehealth: Payer: Self-pay | Admitting: Pulmonary Disease

## 2019-01-10 NOTE — Telephone Encounter (Signed)
LMTCB

## 2019-01-11 NOTE — Telephone Encounter (Signed)
ATC pt, call went straight to voicemail. LMTCB x2 for pt.  

## 2019-01-11 NOTE — Telephone Encounter (Signed)
Patient called back clinic. Stated he had test done by [facility] last week and was calling for the results. He turned in his oxygen on Monday 01/08/19. Advised the patient that once Dr. Ander Slade gets the result information from the [facility]) then he can be contacted with the results and recommendations from Dr. Ander Slade.  I called [facility] and was advised they dropped of the pulse ox for the overnight test on Friday and picked it up on Monday. They never issued oxygen to this patient.   They are actually going to process the download information today and fax it to the attention of Dr. Ander Slade.  According to the last office note dated 09/05/18 the patient was not on oxygen. However the note does state:   "Shortness of breath Obstructive sleep apnea not being treated at present  We will order an in lab sleep study-split-night study Order pulmonary function study  We will see you back in the office in about 4 to 6 weeks  Encouraged to continue working on weight loss efforts High risk of decompensation if obstructive sleep apnea is not treated".  Once fax has been received it will be placed in Dr. Judson Roch box to be given once he is back in clinic to review.

## 2019-01-16 ENCOUNTER — Telehealth: Payer: Self-pay | Admitting: Pulmonary Disease

## 2019-01-16 NOTE — Telephone Encounter (Signed)
I did review his overnight oximetry on room air  Severe number of events  Severe oxygen desaturations  Recommendation:  You should be on BiPAP treatment with oxygen piped into the system  Supplemental oxygen by itself will not help  Patient may make a choice not to be treated-should be made aware that this will put him in harm's way, increase in health problems, risk to the heart-heart attacks, risk of stroke and possibly death.  His options of treatment will be BiPAP plus oxygen supplementation, as stated above he may choose not to treat and accept the risks that come with not treating his identified problems

## 2019-01-16 NOTE — Telephone Encounter (Signed)
Unable to reach patient, no vmail pick up. Line rings, then cuts off. Will attempt call back later.

## 2019-01-17 NOTE — Telephone Encounter (Signed)
We can order 4 L of oxygen  As discussed with him previously Reinforced to him that oxygen supplementation is not the treatment for what he has  Likelihood of harm to his heart, brain, possibility of dying from untreated sleep apnea is significant.

## 2019-01-17 NOTE — Telephone Encounter (Signed)
Pt returning call

## 2019-01-17 NOTE — Telephone Encounter (Signed)
Advised pt of results. Pt understood and nothing further is needed.   Pt states he is not on a Bipap and will not use one because of the mask. AO do you want to just order the oxygen to be sent even thought this is not the recommended treatment? Please advise.

## 2019-01-17 NOTE — Telephone Encounter (Signed)
Still unable to reach the patient, line rings multiple times, no pick up, no vmail option.

## 2019-01-17 NOTE — Telephone Encounter (Signed)
Left message for patient to call back  

## 2019-01-22 ENCOUNTER — Telehealth: Payer: Self-pay | Admitting: Internal Medicine

## 2019-01-22 NOTE — Telephone Encounter (Signed)
Called spoke with patient Patient stated he does not feel his [facility] is severe enough to warrant visit to the ER at this time.  Televisit scheduled with Dr Jenetta Downer for tomorrow 12/1 @ 1430.  Patient okay with this date/time and is aware to seek emergency attention is [facility] worsens.  Will sign off.

## 2019-01-22 NOTE — Telephone Encounter (Signed)
I called and spoke with patient and he reports that he is having increased sob when he lays down to try and sleep. He reports that he has propped his pillows up as Dr. Jenetta Downer told him and its not helping. He denies any other symptoms. Please advise.

## 2019-01-22 NOTE — Telephone Encounter (Signed)
Dr. Hermina Staggers has seen him for  OSA w/ nocturnal Desats - very severe AHI 107 .  Not using CPAP . Has been set up for split night /titration study . And referral to ENT for eval for INSPIRE device .    Unclear what else can be done until can get OSA under control . Also dyspnea could be related to other etiology , will need to have ov to better evaluate .  Or if severe go to ER .   Recent echo showed nml EF .   Please make ov with Dr. Hermina Staggers tomorrow for visit since is having severe episodes . He has multiple openings.   Please contact office for sooner follow up if symptoms do not improve or worsen or seek emergency care

## 2019-01-23 ENCOUNTER — Ambulatory Visit (INDEPENDENT_AMBULATORY_CARE_PROVIDER_SITE_OTHER): Payer: 59 | Admitting: Pulmonary Disease

## 2019-01-23 ENCOUNTER — Encounter: Payer: Self-pay | Admitting: Pulmonary Disease

## 2019-01-23 ENCOUNTER — Other Ambulatory Visit: Payer: Self-pay

## 2019-01-23 ENCOUNTER — Telehealth: Payer: Self-pay | Admitting: Pulmonary Disease

## 2019-01-23 DIAGNOSIS — G4733 Obstructive sleep apnea (adult) (pediatric): Secondary | ICD-10-CM

## 2019-01-23 NOTE — Progress Notes (Signed)
Unable to reach the patient on his cell number (main # listed) no vmail pick up, line rang multiple times.  LVMTCB on patient home # [phone] advising we were trying to reach him for his telephone visit. If not able to be reached, he will need to contact our office to reschedule.

## 2019-01-24 NOTE — Telephone Encounter (Signed)
No show

## 2019-01-25 ENCOUNTER — Telehealth: Payer: Self-pay | Admitting: Internal Medicine

## 2019-01-25 ENCOUNTER — Encounter: Payer: Self-pay | Admitting: Pulmonary Disease

## 2019-01-25 ENCOUNTER — Ambulatory Visit (INDEPENDENT_AMBULATORY_CARE_PROVIDER_SITE_OTHER): Payer: 59 | Admitting: Pulmonary Disease

## 2019-01-25 ENCOUNTER — Telehealth: Payer: Self-pay | Admitting: Pulmonary Disease

## 2019-01-25 ENCOUNTER — Other Ambulatory Visit: Payer: Self-pay

## 2019-01-25 DIAGNOSIS — G4734 Idiopathic sleep related nonobstructive alveolar hypoventilation: Secondary | ICD-10-CM | POA: Diagnosis not present

## 2019-01-25 DIAGNOSIS — R0602 Shortness of breath: Secondary | ICD-10-CM

## 2019-01-25 NOTE — Progress Notes (Signed)
Virtual Visit via Telephone Note  I connected with William Benitez on 01/25/19 at  3:30 PM EST by telephone and verified that I am speaking with the correct person using two identifiers.  Location: Patient: William Benitez Provider: Adrian Prince discussed the limitations, risks, security and privacy concerns of performing an evaluation and management service by telephone and the availability of in person appointments. I also discussed with the patient that there may be a patient responsible charge related to this service. The patient expressed understanding and agreed to proceed.   History of Present Illness: Patient with severe obstructive sleep apnea  He will not use BiPAP despite conversations about this being the best option for treating his obstructive sleep apnea He was referred for evaluation for an inspire device-he is not a candidate because of his weight and severity of his sleep apnea, he was also encouraged by the consultant to reconsider BiPAP use  BiPAP did treat his sleep disordered breathing effectively during titration  He is complaining of shortness of breath The only intervention he has agreed to his oxygen supplementation at night  An overnight oximetry revealed severe desaturations   Observations/Objective: He does sound well on the phone  Assessment and Plan: Severe obstructive sleep apnea, titrated to BiPAP  Only agreeable to use oxygen supplementation at night He is aware of risks of not treating his obstructive sleep apnea He is aware that oxygen supplementation does not treat sleep disordered breathing and this will not alleviate the risks of cardiovascular morbidity mortality associated with untreated sleep disordered breathing  We will order oxygen supplementation at 3 L  We will follow-up with him in about 3 months  Follow Up Instructions:    I discussed the assessment and treatment plan with the patient. The patient was provided an opportunity to ask  questions and all were answered. The patient agreed with the plan and demonstrated an understanding of the instructions.   The patient was advised to call back or seek an in-person evaluation if the symptoms worsen or if the condition fails to improve as anticipated.  I provided 15 minutes of non-face-to-face time during this encounter.   Laurin Coder, MD

## 2019-01-25 NOTE — Telephone Encounter (Signed)
Spoke with pt at length Per pt is wanting to know if you would write a note for work with restrictions if needed from cardiac standpoint Will forward to Dr Margaretann Loveless .Adonis Housekeeper

## 2019-01-25 NOTE — Patient Instructions (Signed)
Oxygen supplementation at 3 L-to be used at night  Encourage you to reconsider use of BiPAP as this is the only thing that will correct your sleep disordered breathing  Oxygen supplementation does not alleviate the risks associated with untreated sleep disordered breathing  Call with significant concerns  Continue weight loss efforts

## 2019-01-25 NOTE — Telephone Encounter (Signed)
Spoke with the pt  He states that he thought that we were ordered o2 for him  Wants to discuss this  Looks like he missed the last scheduled televisit, so I have scheduled him for one with Dr Jenetta Downer today to discuss this

## 2019-01-25 NOTE — Telephone Encounter (Signed)
Patient would like to go over results from recent tests. The patient spoke with his PCP and the patient feels there is some miscommunication that will need cleared up.

## 2019-01-31 NOTE — Telephone Encounter (Signed)
Patient appears to have normal cardiovascular testing. What work restrictions is he requesting? This can likely be coordinated through Dr. Quay Burow' office if limitations exist from a non-cardiac perspective. I would be happy to discuss it further in a virtual visit follow up. Please arrange at patient's convenience.

## 2019-02-01 NOTE — Telephone Encounter (Signed)
Attempted to contact pt and phone just rings no answer and no way to leave a message Will try later ./cy

## 2019-02-01 NOTE — Telephone Encounter (Signed)
Pt aware of recommendations and would like a virtual visit to discuss cardiac status Appt ( virtual visit ) made for 02/26/19 at 11:40 am with Dr Margaretann Loveless .Adonis Housekeeper

## 2019-02-26 ENCOUNTER — Telehealth: Payer: Self-pay

## 2019-02-26 ENCOUNTER — Telehealth: Payer: 59 | Admitting: Internal Medicine

## 2019-02-26 NOTE — Telephone Encounter (Signed)
Called patient in reference to virtual appt @ 11:40AM  with Dr Jacques Navy , no answer unable to leave message. jm

## 2019-02-26 NOTE — Telephone Encounter (Signed)
Called and spoke with wife, she wanted to cancel virtual appt. Will call back to rsch

## 2019-07-12 ENCOUNTER — Ambulatory Visit: Payer: 59 | Admitting: Pulmonary Disease

## 2019-09-10 ENCOUNTER — Ambulatory Visit: Payer: Self-pay | Admitting: Internal Medicine

## 2019-09-10 DIAGNOSIS — Z0289 Encounter for other administrative examinations: Secondary | ICD-10-CM

## 2019-11-19 IMAGING — DX DG CHEST 2V
2 series · 2 of 2 positions shown · non-contrast
Comparison: 05/01/2017

CLINICAL DATA: Shortness of breath and left flank pain since last
night. Nonsmoker.

EXAM:
CHEST - 2 VIEW

[chest pa]
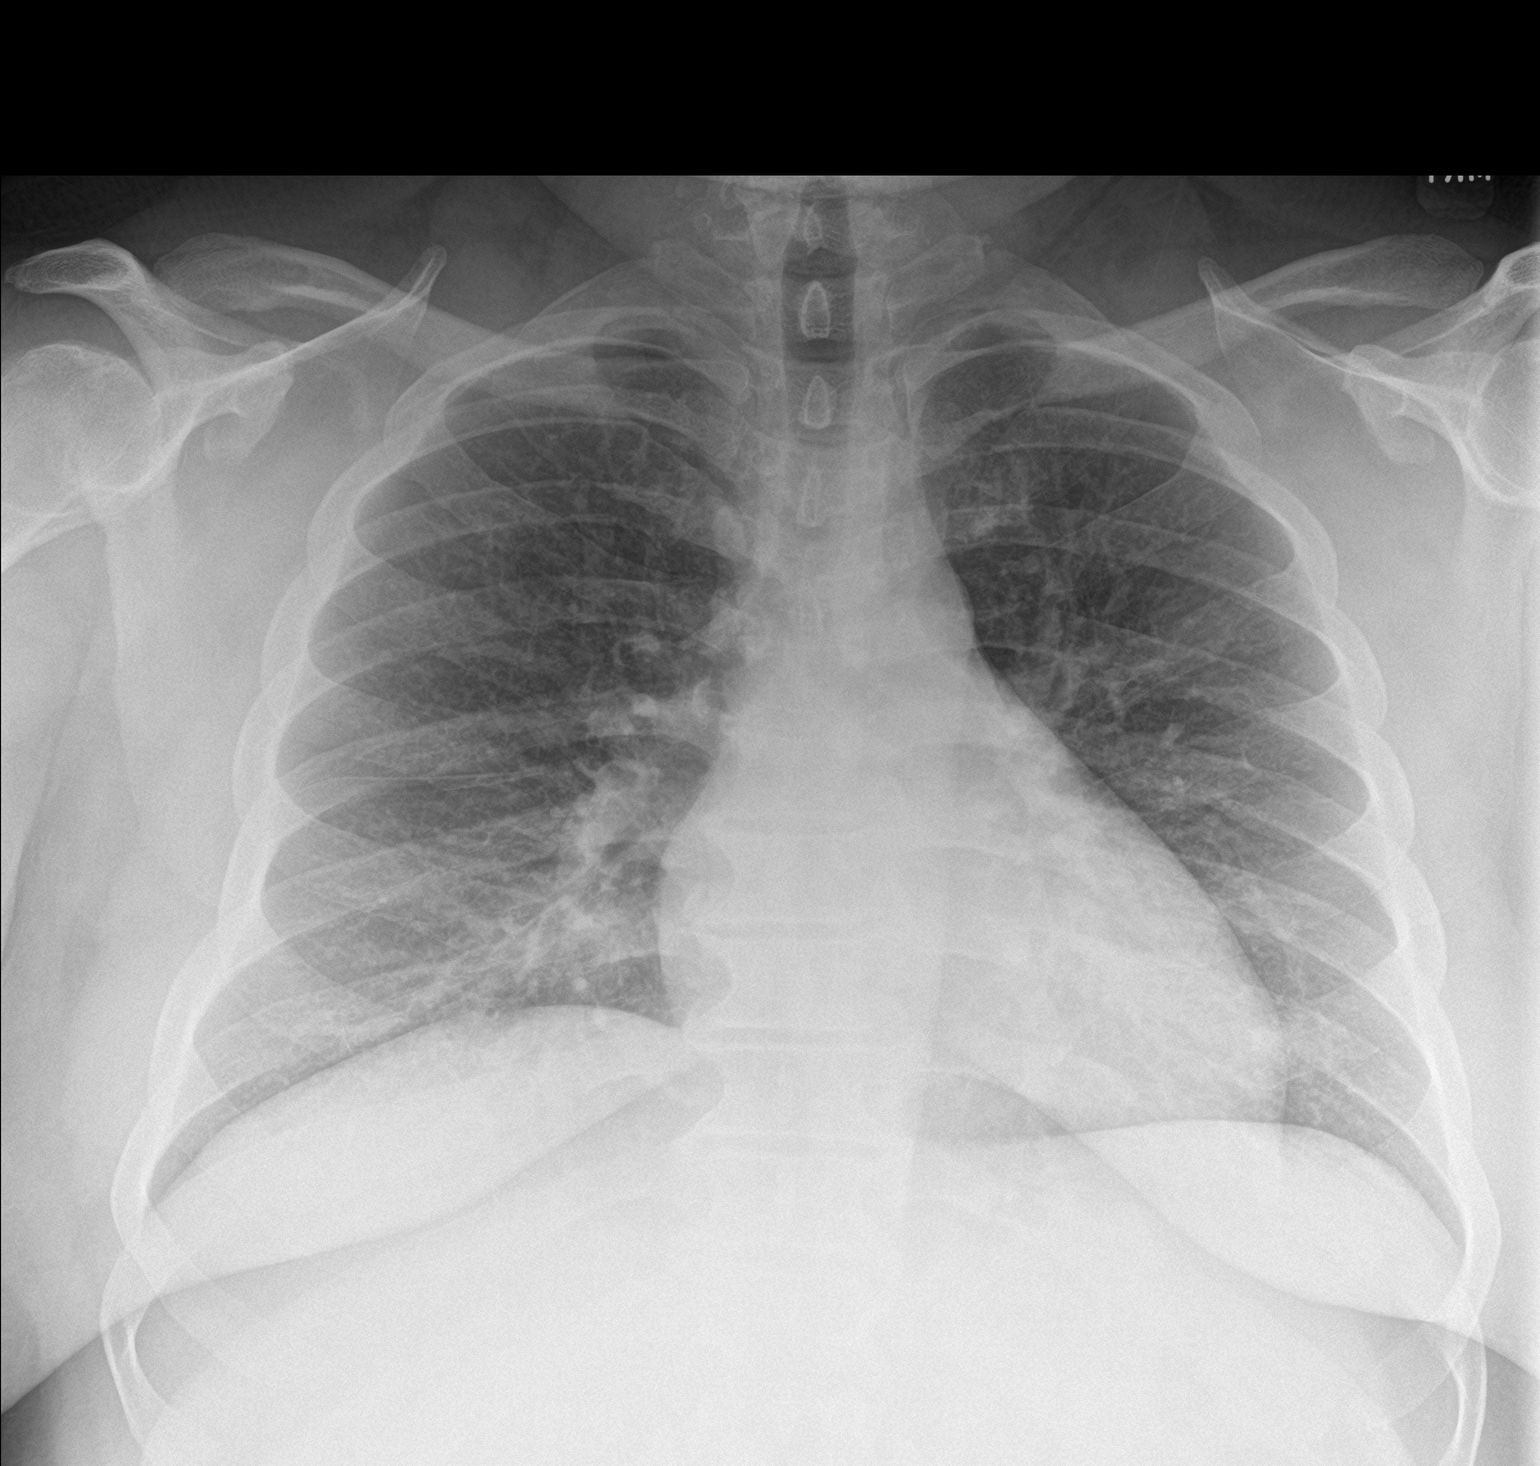

[chest lat]
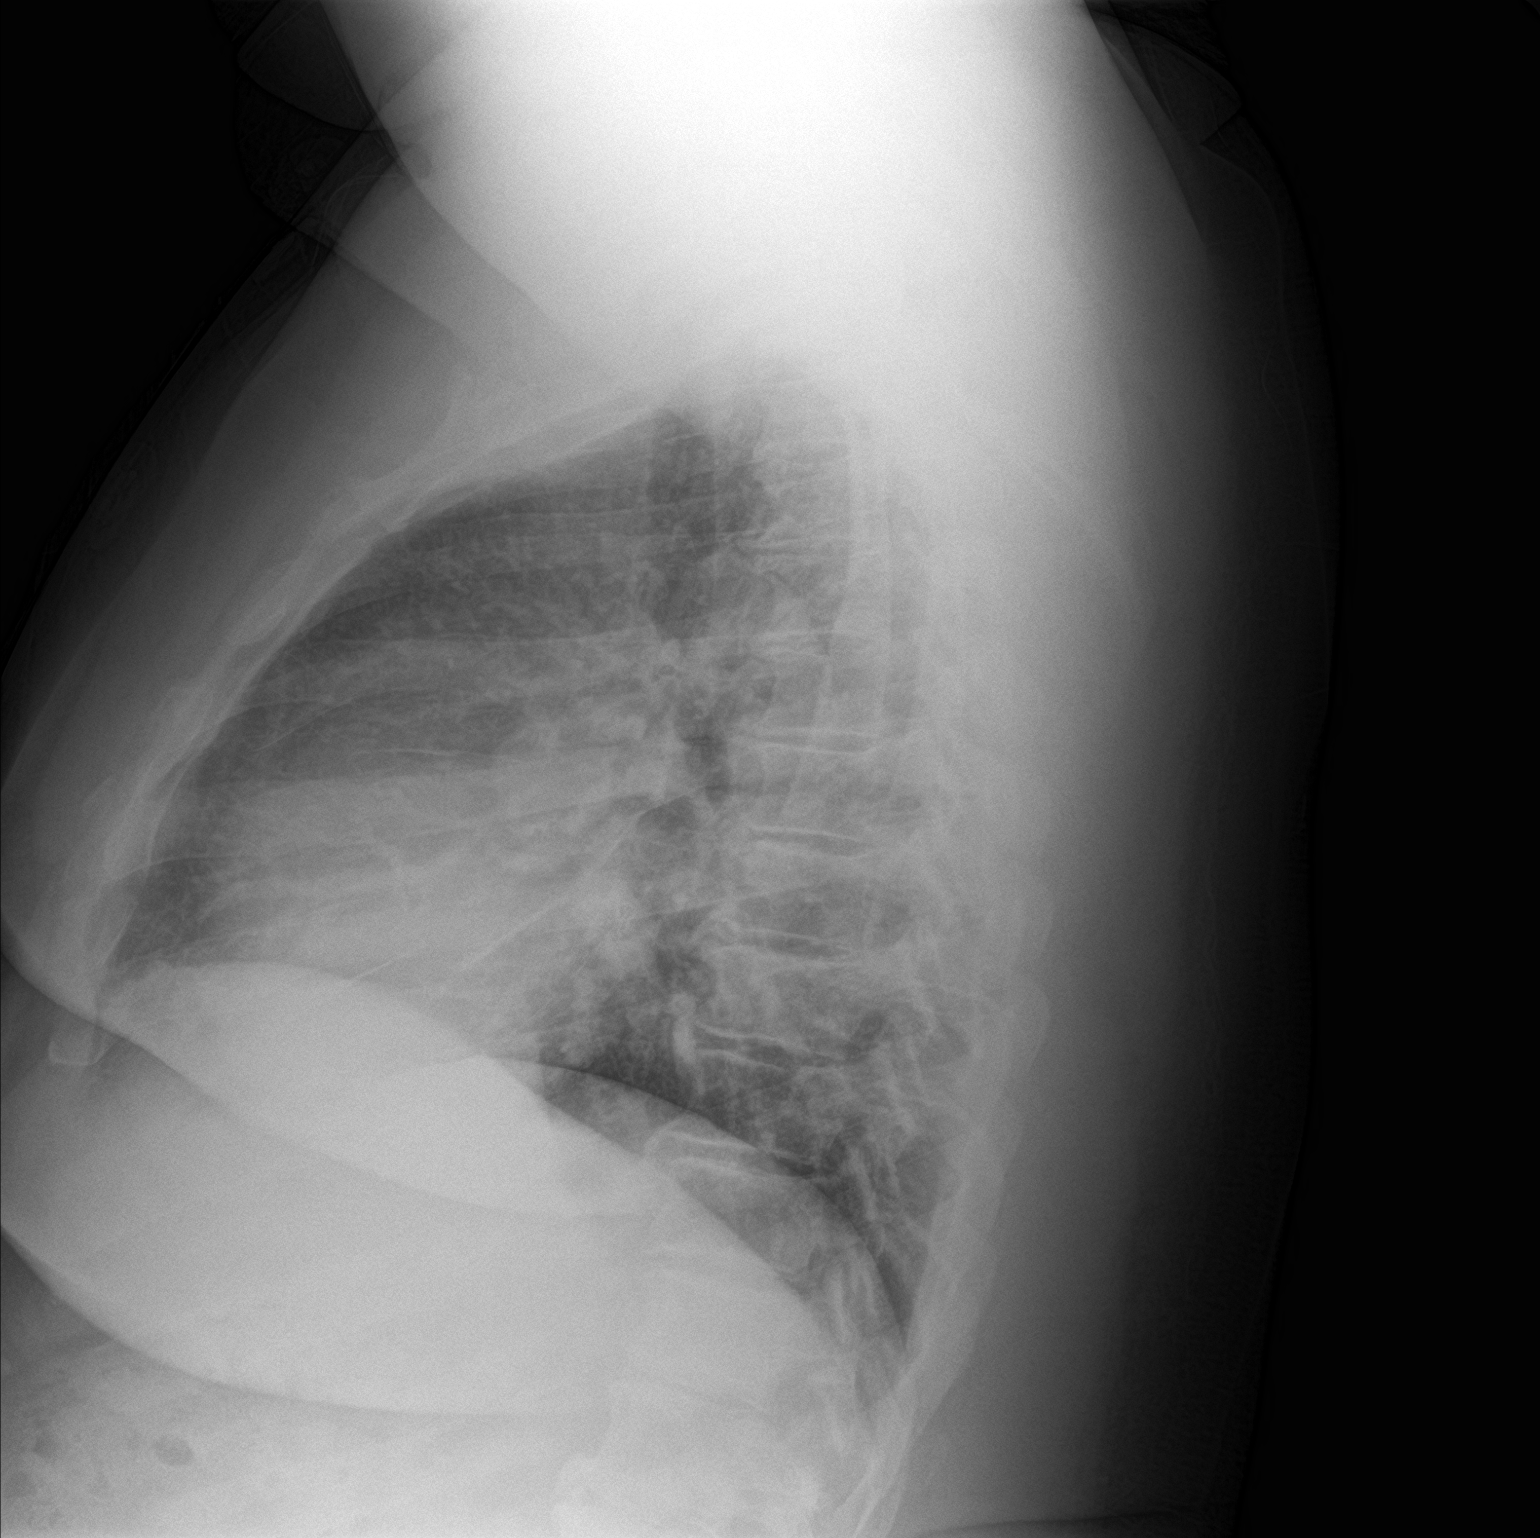

[2 of 2 positions shown; findings below may reference images not displayed]

FINDINGS: The heart size and mediastinal contours are within normal limits.
Both lungs are clear. The visualized skeletal structures are
unremarkable.
IMPRESSION: No active cardiopulmonary disease.

## 2019-11-20 ENCOUNTER — Other Ambulatory Visit: Payer: Self-pay

## 2019-11-20 ENCOUNTER — Ambulatory Visit
Admission: EM | Admit: 2019-11-20 | Discharge: 2019-11-20 | Disposition: A | Discharge status: Home / Self Care | Payer: Medicare Other

## 2019-11-20 DIAGNOSIS — N481 Balanitis: Secondary | ICD-10-CM

## 2019-11-20 DIAGNOSIS — L02415 Cutaneous abscess of right lower limb: Secondary | ICD-10-CM | POA: Diagnosis not present

## 2019-11-20 DIAGNOSIS — L0291 Cutaneous abscess, unspecified: Secondary | ICD-10-CM

## 2019-11-20 DIAGNOSIS — N4889 Other specified disorders of penis: Secondary | ICD-10-CM | POA: Diagnosis not present

## 2019-11-20 DIAGNOSIS — L03115 Cellulitis of right lower limb: Secondary | ICD-10-CM

## 2019-11-20 MED ORDER — CLOTRIMAZOLE 1 % EX CREA: TOPICAL_CREAM | CUTANEOUS | 0 refills | Status: DC

## 2019-11-20 MED ORDER — FLUCONAZOLE 150 MG PO TABS: 150.0000 mg | ORAL_TABLET | Freq: Once | ORAL | 0 refills | Status: AC

## 2019-11-20 MED ORDER — CEPHALEXIN 500 MG PO CAPS: 500.0000 mg | ORAL_CAPSULE | Freq: Four times a day (QID) | ORAL | 0 refills | Status: DC

## 2019-11-20 NOTE — ED Provider Notes (Signed)
[facility]    CSN: [id number] Arrival date & time: 11/20/19  1710      History   Chief Complaint Chief Complaint  Patient presents with   Abscess    HPI Zyheir Daft is a 49 y.o. male.   49 year old male comes in for multiple complaints  1. Right medial thigh pain/swelling x 2 days. Area is painful, warm. Denies injury, fever.  2. Swelling/irritation to the penis for 1+ months. Occasional pain. Still able to fully retract foreskin, but having more trouble urinating due to the irritation.     Past Medical History:  Diagnosis Date   Arthritis    Asthma    CHF (congestive heart failure) ([place])    Hypertension    Sleep apnea     Patient Active Problem List   Diagnosis Date Noted   Tingling of both feet 12/21/2018   Cellulitis of foot, left 08/29/2018   Infected cyst of skin 07/04/2018   Type 2 diabetes mellitus without complication ([place]) 23/53/6144   SOB (shortness of breath) 04/24/2018   OSA (obstructive sleep apnea), severe 08/2018 04/24/2018   Bilateral primary osteoarthritis of knee 04/24/2018   Muscle cramping 04/24/2018   GERD (gastroesophageal reflux disease) 04/24/2018   Epigastric pain 04/24/2018   Black stool 04/24/2018   Asthma 04/23/2018   Hyperlipidemia 04/23/2018   Chronic diastolic heart failure ([place])    Hypertension     History reviewed. No pertinent surgical history.     Home Medications    Prior to Admission medications   Medication Sig Start Date End Date Taking? Authorizing Provider  Tiotropium Bromide-Olodaterol (STIOLTO RESPIMAT) 2.5-2.5 MCG/ACT AERS Inhale into the lungs.   Yes [provider]  albuterol (PROVENTIL HFA;VENTOLIN HFA) 108 (90 Base) MCG/ACT inhaler Inhale 1-2 puffs into the lungs every 6 (six) hours as needed for wheezing or shortness of breath. 04/24/18   Binnie Rail, MD  blood glucose meter kit and supplies KIT Dispense based on patient and insurance preference. Use up to  four times daily as directed. (FOR E11.9). 12/21/18   Binnie Rail, MD  carvedilol (COREG) 25 MG tablet Take 1 tablet (25 mg total) by mouth 2 (two) times daily with a meal. 05/31/18   Burns, Claudina Lick, MD  cephALEXin (KEFLEX) 500 MG capsule Take 1 capsule (500 mg total) by mouth 4 (four) times daily. 11/20/19   Tasia Catchings, Harry Bark V, PA-C  ciclesonide (ALVESCO) 160 MCG/ACT inhaler Inhale 1 puff into the lungs 2 (two) times daily.    [provider]  clotrimazole (LOTRIMIN) 1 % cream Apply to affected area 2 times daily 11/20/19   Tasia Catchings, Aalani Aikens V, PA-C  fluconazole (DIFLUCAN) 150 MG tablet Take 1 tablet (150 mg total) by mouth once for 1 dose. 11/20/19 11/20/19  Ok Edwards, PA-C  furosemide (LASIX) 40 MG tablet Take 1 tablet (40 mg total) by mouth 2 (two) times daily. 04/20/18   Montine Circle, PA-C  ibuprofen (ADVIL) 800 MG tablet Take 800 mg by mouth 3 (three) times daily. 08/16/18   [provider]  losartan (COZAAR) 25 MG tablet Take 1 tablet (25 mg total) by mouth daily. 08/01/18 12/26/18  Elouise Munroe, MD  metFORMIN (GLUCOPHAGE) 500 MG tablet Take 1 tablet (500 mg total) by mouth 2 (two) times daily with a meal. 05/31/18   Burns, Claudina Lick, MD  pantoprazole (PROTONIX) 40 MG tablet Take 1 tablet (40 mg total) by mouth daily. 05/31/18   Binnie Rail, MD  rosuvastatin (  CRESTOR) 40 MG tablet Take 1 tablet (40 mg total) by mouth daily. 04/27/18   Pincus Sanes, MD    Family History Family History  Problem Relation Age of Onset   Hypertension Mother    Liver disease Mother    Kidney disease Father     Social History Social History   Tobacco Use   Smoking status: Former Smoker   Smokeless tobacco: Never Used  Building services engineer Use: Never used  Substance Use Topics   Alcohol use: No   Drug use: Not Currently    Types: Marijuana    Comment: occ     Allergies   Patient has no known allergies.   Review of Systems Review of Systems  Reason unable to perform ROS: See HPI as  above.     Physical Exam Triage Vital Signs ED Triage Vitals  Enc Vitals Group     BP 11/20/19 1934 (!) 150/90     Pulse Rate 11/20/19 1934 (!) 106     Resp 11/20/19 1934 20     Temp 11/20/19 1934 98.2 F (36.8 C)     Temp Source 11/20/19 1934 Oral     SpO2 11/20/19 1934 96 %     Weight --      Height --      Head Circumference --      Peak Flow --      Pain Score 11/20/19 1949 8     Pain Loc --      Pain Edu? --      Excl. in GC? --    No data found.  Updated Vital Signs BP (!) 150/90 (BP Location: Left Arm)    Pulse (!) 106    Temp 98.2 F (36.8 C) (Oral)    Resp 20    SpO2 96%   Visual Acuity Right Eye Distance:   Left Eye Distance:   Bilateral Distance:    Right Eye Near:   Left Eye Near:    Bilateral Near:     Physical Exam Exam conducted with a chaperone present.  Constitutional:      General: He is not in acute distress.    Appearance: Normal appearance. He is well-developed. He is not toxic-appearing or diaphoretic.  HENT:     Head: Normocephalic and atraumatic.  Eyes:     Conjunctiva/sclera: Conjunctivae normal.     Pupils: Pupils are equal, round, and reactive to light.  Pulmonary:     Effort: Pulmonary effort is normal. No respiratory distress.  Genitourinary:    Comments: Patient uncircumcised. Swelling and erythema to the foreskin with white clumpy residue to the foreskin. Foreskin is able to be retracted completely.  Musculoskeletal:     Cervical back: Normal range of motion and neck supple.  Skin:    General: Skin is warm and dry.     Comments: 0.5cm round superficial abscess to the right upper medial thigh to the skinfold. Surrounding cellulitis.  Neurological:     Mental Status: He is alert and oriented to person, place, and time.      UC Treatments / Results  Labs (all labs ordered are listed, but only abnormal results are displayed) Labs Reviewed - No data to display  EKG   Radiology No results found.  Procedures Incision  and Drainage  Date/Time: 11/20/2019 11:11 PM Performed by: Belinda Fisher, PA-C Authorized by: Belinda Fisher, PA-C   Consent:    Consent obtained:  Verbal   Consent given  by:  Patient   Risks discussed:  Incomplete drainage, bleeding and pain   Alternatives discussed:  Alternative treatment Location:    Type:  Abscess   Size:  0.5cm   Location:  Lower extremity   Lower extremity location:  Leg   Leg location:  R upper leg Pre-procedure details:    Skin preparation:  Chloraprep Anesthesia (see MAR for exact dosages):    Anesthesia method:  None Procedure type:    Complexity:  Simple Procedure details:    Needle aspiration: no     Incision types:  Stab incision   Incision depth:  Dermal   Scalpel blade:  11   Drainage:  Purulent   Drainage amount:  Scant   Wound treatment:  Wound left open   Packing materials:  None Post-procedure details:    Patient tolerance of procedure:  Tolerated well, no immediate complications   (including critical care time)  Medications Ordered in UC Medications - No data to display  Initial Impression / Assessment and Plan / UC Course  I have reviewed the triage vital signs and the nursing notes.  Pertinent labs & imaging results that were available during my care of the patient were reviewed by me and considered in my medical decision making (see chart for details).    1. Abscess/cellulitis Patient tolerated procedure well. Start keflex for surrounding cellulitis. Wound care instructions given. Return precautions given. Patient expresses understanding and agrees to plan.   2. Balanitis Given also starting abx as above, will provide one dose of diflucan. Clotrimazole as directed. Return precautions given.  Final Clinical Impressions(s) / UC Diagnoses   Final diagnoses:  Abscess  Cellulitis of right lower extremity  Balanitis    ED Prescriptions    Medication Sig Dispense Auth. Provider   cephALEXin (KEFLEX) 500 MG capsule Take 1 capsule  (500 mg total) by mouth 4 (four) times daily. 28 capsule Jaydien Panepinto V, PA-C   fluconazole (DIFLUCAN) 150 MG tablet Take 1 tablet (150 mg total) by mouth once for 1 dose. 1 tablet Edita Weyenberg V, PA-C   clotrimazole (LOTRIMIN) 1 % cream Apply to affected area 2 times daily 15 g Ok Edwards, PA-C     PDMP not reviewed this encounter.   Ok Edwards, PA-C 11/20/19 2313

## 2019-11-20 NOTE — Discharge Instructions (Signed)
Abscess/cellulitis Start keflex as directed. You can remove current dressing in 24 hours. Keep wound clean and dry. You can clean gently with soap and water. Warm compresses as directed. Monitor for spreading redness, increased warmth, increased swelling, fever, follow up for reevaluation needed.  Balanitis  Diflucan 1 dose, this will cover for yeast infection x 1 week. Start clotrimazole cream as well. Keep area clean and dry. Follow up with PCP if symptoms not improving.

## 2019-11-20 NOTE — ED Triage Notes (Signed)
Pt c/o abscess to rt upper inner thigh x2 days. Denies drainage. Pt states his penis inverted with swelling for over a month and makes it difficulty to urinate.

## 2019-12-27 ENCOUNTER — Telehealth: Payer: Self-pay | Admitting: *Deleted

## 2019-12-27 NOTE — Telephone Encounter (Signed)
We have made several attempts to contact William Benitez,no voice mail.

## 2020-03-18 ENCOUNTER — Encounter: Payer: Self-pay | Admitting: Internal Medicine

## 2020-05-20 ENCOUNTER — Other Ambulatory Visit: Payer: Self-pay

## 2020-05-20 ENCOUNTER — Encounter (HOSPITAL_COMMUNITY): Payer: Self-pay

## 2020-05-20 ENCOUNTER — Emergency Department (HOSPITAL_COMMUNITY)
Admission: EM | Admit: 2020-05-20 | Discharge: 2020-05-21 | Disposition: A | Discharge status: Home / Self Care | Payer: PRIVATE HEALTH INSURANCE | Attending: Emergency Medicine | Admitting: Emergency Medicine

## 2020-05-20 DIAGNOSIS — J45909 Unspecified asthma, uncomplicated: Secondary | ICD-10-CM | POA: Diagnosis not present

## 2020-05-20 DIAGNOSIS — S161XXA Strain of muscle, fascia and tendon at neck level, initial encounter: Secondary | ICD-10-CM

## 2020-05-20 DIAGNOSIS — Z7984 Long term (current) use of oral hypoglycemic drugs: Secondary | ICD-10-CM | POA: Insufficient documentation

## 2020-05-20 DIAGNOSIS — Z87891 Personal history of nicotine dependence: Secondary | ICD-10-CM | POA: Insufficient documentation

## 2020-05-20 DIAGNOSIS — E119 Type 2 diabetes mellitus without complications: Secondary | ICD-10-CM | POA: Diagnosis not present

## 2020-05-20 DIAGNOSIS — Z79899 Other long term (current) drug therapy: Secondary | ICD-10-CM | POA: Diagnosis not present

## 2020-05-20 DIAGNOSIS — M546 Pain in thoracic spine: Secondary | ICD-10-CM | POA: Diagnosis not present

## 2020-05-20 DIAGNOSIS — I5032 Chronic diastolic (congestive) heart failure: Secondary | ICD-10-CM | POA: Diagnosis not present

## 2020-05-20 DIAGNOSIS — S199XXA Unspecified injury of neck, initial encounter: Secondary | ICD-10-CM | POA: Diagnosis present

## 2020-05-20 DIAGNOSIS — I110 Hypertensive heart disease with heart failure: Secondary | ICD-10-CM | POA: Diagnosis not present

## 2020-05-20 DIAGNOSIS — Y92410 Unspecified street and highway as the place of occurrence of the external cause: Secondary | ICD-10-CM | POA: Diagnosis not present

## 2020-05-20 HISTORY — DX: Type 2 diabetes mellitus without complications: E11.9
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
## 2020-05-21 MED ORDER — KETOROLAC TROMETHAMINE 60 MG/2ML IM SOLN
30.0000 mg | Freq: Once | INTRAMUSCULAR | Status: AC
  Administered 2020-05-21: 30 mg via INTRAMUSCULAR
  Filled 2020-05-21: qty 2

## 2020-05-21 MED ORDER — METHOCARBAMOL 500 MG PO TABS: 500.0000 mg | ORAL_TABLET | Freq: Two times a day (BID) | ORAL | 0 refills | Status: DC
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
## 2020-07-24 ENCOUNTER — Emergency Department (HOSPITAL_COMMUNITY)
Admission: EM | Admit: 2020-07-24 | Discharge: 2020-07-24 | Disposition: A | Discharge status: Home / Self Care | Payer: PRIVATE HEALTH INSURANCE | Attending: Emergency Medicine | Admitting: Emergency Medicine

## 2020-07-24 ENCOUNTER — Encounter (HOSPITAL_COMMUNITY): Payer: Self-pay

## 2020-07-24 ENCOUNTER — Other Ambulatory Visit: Payer: Self-pay

## 2020-07-24 DIAGNOSIS — Z23 Encounter for immunization: Secondary | ICD-10-CM | POA: Insufficient documentation

## 2020-07-24 DIAGNOSIS — Z79899 Other long term (current) drug therapy: Secondary | ICD-10-CM | POA: Insufficient documentation

## 2020-07-24 DIAGNOSIS — J45909 Unspecified asthma, uncomplicated: Secondary | ICD-10-CM | POA: Diagnosis not present

## 2020-07-24 DIAGNOSIS — I5032 Chronic diastolic (congestive) heart failure: Secondary | ICD-10-CM | POA: Diagnosis not present

## 2020-07-24 DIAGNOSIS — Z7951 Long term (current) use of inhaled steroids: Secondary | ICD-10-CM | POA: Insufficient documentation

## 2020-07-24 DIAGNOSIS — I110 Hypertensive heart disease with heart failure: Secondary | ICD-10-CM | POA: Insufficient documentation

## 2020-07-24 DIAGNOSIS — E785 Hyperlipidemia, unspecified: Secondary | ICD-10-CM | POA: Insufficient documentation

## 2020-07-24 DIAGNOSIS — L02411 Cutaneous abscess of right axilla: Secondary | ICD-10-CM | POA: Diagnosis not present

## 2020-07-24 DIAGNOSIS — R2231 Localized swelling, mass and lump, right upper limb: Secondary | ICD-10-CM | POA: Diagnosis present

## 2020-07-24 DIAGNOSIS — Z7984 Long term (current) use of oral hypoglycemic drugs: Secondary | ICD-10-CM | POA: Insufficient documentation

## 2020-07-24 DIAGNOSIS — E1169 Type 2 diabetes mellitus with other specified complication: Secondary | ICD-10-CM | POA: Insufficient documentation

## 2020-07-24 DIAGNOSIS — Z87891 Personal history of nicotine dependence: Secondary | ICD-10-CM | POA: Insufficient documentation

## 2020-07-24 MED ORDER — IBUPROFEN 800 MG PO TABS
800.0000 mg | ORAL_TABLET | Freq: Once | ORAL | Status: AC
  Administered 2020-07-24: 800 mg via ORAL
  Filled 2020-07-24: qty 1

## 2020-07-24 MED ORDER — IBUPROFEN 600 MG PO TABS: 600.0000 mg | ORAL_TABLET | Freq: Four times a day (QID) | ORAL | 0 refills | Status: DC | PRN

## 2020-07-24 MED ORDER — SULFAMETHOXAZOLE-TRIMETHOPRIM 800-160 MG PO TABS: 1.0000 | ORAL_TABLET | Freq: Two times a day (BID) | ORAL | 0 refills | Status: AC

## 2020-07-24 MED ORDER — TETANUS-DIPHTH-ACELL PERTUSSIS 5-2.5-18.5 LF-MCG/0.5 IM SUSY
0.5000 mL | PREFILLED_SYRINGE | Freq: Once | INTRAMUSCULAR | Status: AC
  Administered 2020-07-24: 0.5 mL via INTRAMUSCULAR
  Filled 2020-07-24: qty 0.5

## 2020-07-24 NOTE — Discharge Instructions (Signed)
Please apply warm moist compress to affected area several times daily to help encourage with drainage.  Take antibiotic as prescribed.  Take ibuprofen as needed for pain.  Return if your symptoms worsen or if you have any other concern.

## 2020-07-24 NOTE — ED Notes (Signed)
Pt discharged from this ED in stable condition at this time. All discharge instructions and follow up care reviewed with pt with no further questions at this time. Pt ambulatory with steady gait, clear speech.  

## 2020-07-24 NOTE — ED Provider Notes (Signed)
[facility] DEPT Provider Note   CSN: [id number] Arrival date & time: 07/24/20  1143     History Chief Complaint  Patient presents with  . Abscess    William Benitez is a 50 y.o. male.  The history is provided by the patient and medical records. No language interpreter was used.  Abscess Associated symptoms: no fever      50 year old male significant history of diabetes presenting with concern of skin infection.  Patient endorsed having pain and swelling to his right axillary region for about 3 days and this morning an abscess in the affected area ruptured oozing out.  Discharge.  He did report some mild improvement of pain after ruptured.  He denies any associated fever no chest pain.  He is not up-to-date with tetanus.  He has had abscess to his gluteus in the past requiring incision and drainage.  He denies any specific treatment tried at home  Past Medical History:  Diagnosis Date  . Arthritis   . Asthma   . CHF (congestive heart failure) ([place])   . Diabetes mellitus without complication ([place])   . Hypertension   . Sleep apnea     Patient Active Problem List   Diagnosis Date Noted  . Tingling of both feet 12/21/2018  . Cellulitis of foot, left 08/29/2018  . Infected cyst of skin 07/04/2018  . Type 2 diabetes mellitus without complication ([place]) 35/59/7416  . SOB (shortness of breath) 04/24/2018  . OSA (obstructive sleep apnea), severe 08/2018 04/24/2018  . Bilateral primary osteoarthritis of knee 04/24/2018  . Muscle cramping 04/24/2018  . GERD (gastroesophageal reflux disease) 04/24/2018  . Epigastric pain 04/24/2018  . Black stool 04/24/2018  . Asthma 04/23/2018  . Hyperlipidemia 04/23/2018  . Chronic diastolic heart failure ([place])   . Hypertension     History reviewed. No pertinent surgical history.     Family History  Problem Relation Age of Onset  . Hypertension Mother   . Liver disease Mother   . Kidney disease Father      Social History   Tobacco Use  . Smoking status: Former Research scientist (life sciences)  . Smokeless tobacco: Never Used  Vaping Use  . Vaping Use: Never used  Substance Use Topics  . Alcohol use: No  . Drug use: Not Currently    Types: Marijuana    Comment: occ    Home Medications Prior to Admission medications   Medication Sig Start Date End Date Taking? Authorizing Provider  albuterol (PROVENTIL HFA;VENTOLIN HFA) 108 (90 Base) MCG/ACT inhaler Inhale 1-2 puffs into the lungs every 6 (six) hours as needed for wheezing or shortness of breath. 04/24/18   Binnie Rail, MD  blood glucose meter kit and supplies KIT Dispense based on patient and insurance preference. Use up to four times daily as directed. (FOR E11.9). 12/21/18   Binnie Rail, MD  carvedilol (COREG) 25 MG tablet Take 1 tablet (25 mg total) by mouth 2 (two) times daily with a meal. 05/31/18   Burns, Claudina Lick, MD  cephALEXin (KEFLEX) 500 MG capsule Take 1 capsule (500 mg total) by mouth 4 (four) times daily. 11/20/19   Tasia Catchings, Amy V, PA-C  ciclesonide (ALVESCO) 160 MCG/ACT inhaler Inhale 1 puff into the lungs 2 (two) times daily.    [provider]  clotrimazole (LOTRIMIN) 1 % cream Apply to affected area 2 times daily 11/20/19   Tasia Catchings, Amy V, PA-C  furosemide (LASIX) 40 MG tablet Take 1 tablet (40 mg total) by  mouth 2 (two) times daily. 04/20/18   Montine Circle, PA-C  ibuprofen (ADVIL) 800 MG tablet Take 800 mg by mouth 3 (three) times daily. 08/16/18   [provider]  losartan (COZAAR) 25 MG tablet Take 1 tablet (25 mg total) by mouth daily. 08/01/18 12/26/18  Elouise Munroe, MD  metFORMIN (GLUCOPHAGE) 500 MG tablet Take 1 tablet (500 mg total) by mouth 2 (two) times daily with a meal. 05/31/18   Burns, Claudina Lick, MD  methocarbamol (ROBAXIN) 500 MG tablet Take 1 tablet (500 mg total) by mouth 2 (two) times daily. 05/21/20   McDonald, Mia A, PA-C  pantoprazole (PROTONIX) 40 MG tablet Take 1 tablet (40 mg total) by mouth daily. 05/31/18    Binnie Rail, MD  rosuvastatin (CRESTOR) 40 MG tablet Take 1 tablet (40 mg total) by mouth daily. 04/27/18   Binnie Rail, MD  Tiotropium Bromide-Olodaterol (STIOLTO RESPIMAT) 2.5-2.5 MCG/ACT AERS Inhale into the lungs.    [provider]    Allergies    Patient has no known allergies.  Review of Systems   Review of Systems  Constitutional: Negative for fever.  Skin: Positive for wound.    Physical Exam Updated Vital Signs BP (!) 161/87 (BP Location: Left Arm)   Pulse 84   Temp 98.5 F (36.9 C) (Oral)   Resp 18   SpO2 100%   Physical Exam Vitals and nursing note reviewed.  Constitutional:      General: He is not in acute distress.    Appearance: He is well-developed. He is obese.  HENT:     Head: Atraumatic.  Eyes:     Conjunctiva/sclera: Conjunctivae normal.  Musculoskeletal:     Cervical back: Neck supple.  Skin:    Findings: No rash.     Comments: Right axillary fold: Area of induration, with a small opening on the skin oozing out purulent discharge.  Area is tender to palpation with mild surrounding erythema but no obvious fluctuance appreciated.  Neurological:     Mental Status: He is alert.     ED Results / Procedures / Treatments   Labs (all labs ordered are listed, but only abnormal results are displayed) Labs Reviewed - No data to display  EKG None  Radiology No results found.  Procedures Procedures   Medications Ordered in ED Medications - No data to display  ED Course  I have reviewed the triage vital signs and the nursing notes.  Pertinent labs & imaging results that were available during my care of the patient were reviewed by me and considered in my medical decision making (see chart for details).    MDM Rules/Calculators/A&P                          BP (!) 161/87 (BP Location: Left Arm)   Pulse 84   Temp 98.5 F (36.9 C) (Oral)   Resp 18   SpO2 100%   Final Clinical Impression(s) / ED Diagnoses Final diagnoses:   Abscess of axilla, right    Rx / DC Orders ED Discharge Orders         Ordered    ibuprofen (ADVIL) 600 MG tablet  Every 6 hours PRN        07/24/20 1316    sulfamethoxazole-trimethoprim (BACTRIM DS) 800-160 MG tablet  2 times daily        07/24/20 1316         1:12 PM Patient had a  cutaneous abscess to his right axillary fold that has since ruptured, oozing purulent discharge.  Will discharge home with antibiotic, warm compress, and return precaution.  I did discuss further I&D, patient prefers to hold off at this time.  Will update tetanus   Domenic Moras, PA-C 07/24/20 [place], [place], MD 07/25/20 [phone]

## 2020-07-24 NOTE — ED Triage Notes (Signed)
Pt arrived via POV, states abscess under left arm that has burst. Denies any fever/chills at home.

## 2020-10-16 ENCOUNTER — Other Ambulatory Visit: Payer: Self-pay

## 2020-10-16 ENCOUNTER — Inpatient Hospital Stay (HOSPITAL_COMMUNITY)
Admission: EM | Admit: 2020-10-16 | Discharge: 2020-10-20 | DRG: 603 | Disposition: A | Discharge status: Home / Self Care | Payer: PRIVATE HEALTH INSURANCE | Attending: Internal Medicine | Admitting: Internal Medicine

## 2020-10-16 ENCOUNTER — Encounter (HOSPITAL_COMMUNITY): Payer: Self-pay | Admitting: Emergency Medicine

## 2020-10-16 DIAGNOSIS — E785 Hyperlipidemia, unspecified: Secondary | ICD-10-CM | POA: Diagnosis present

## 2020-10-16 DIAGNOSIS — Z8249 Family history of ischemic heart disease and other diseases of the circulatory system: Secondary | ICD-10-CM

## 2020-10-16 DIAGNOSIS — L03317 Cellulitis of buttock: Principal | ICD-10-CM | POA: Diagnosis present

## 2020-10-16 DIAGNOSIS — E1165 Type 2 diabetes mellitus with hyperglycemia: Secondary | ICD-10-CM

## 2020-10-16 DIAGNOSIS — G4733 Obstructive sleep apnea (adult) (pediatric): Secondary | ICD-10-CM | POA: Diagnosis present

## 2020-10-16 DIAGNOSIS — L02211 Cutaneous abscess of abdominal wall: Secondary | ICD-10-CM | POA: Diagnosis present

## 2020-10-16 DIAGNOSIS — E860 Dehydration: Secondary | ICD-10-CM | POA: Diagnosis present

## 2020-10-16 DIAGNOSIS — Z20822 Contact with and (suspected) exposure to covid-19: Secondary | ICD-10-CM | POA: Diagnosis present

## 2020-10-16 DIAGNOSIS — J45909 Unspecified asthma, uncomplicated: Secondary | ICD-10-CM | POA: Diagnosis present

## 2020-10-16 DIAGNOSIS — Z841 Family history of disorders of kidney and ureter: Secondary | ICD-10-CM

## 2020-10-16 DIAGNOSIS — E66813 Obesity, class 3: Secondary | ICD-10-CM

## 2020-10-16 DIAGNOSIS — E872 Acidosis: Secondary | ICD-10-CM | POA: Diagnosis present

## 2020-10-16 DIAGNOSIS — Z9119 Patient's noncompliance with other medical treatment and regimen: Secondary | ICD-10-CM

## 2020-10-16 DIAGNOSIS — Z7984 Long term (current) use of oral hypoglycemic drugs: Secondary | ICD-10-CM

## 2020-10-16 DIAGNOSIS — I10 Essential (primary) hypertension: Secondary | ICD-10-CM | POA: Diagnosis present

## 2020-10-16 DIAGNOSIS — L0231 Cutaneous abscess of buttock: Secondary | ICD-10-CM

## 2020-10-16 DIAGNOSIS — B954 Other streptococcus as the cause of diseases classified elsewhere: Secondary | ICD-10-CM | POA: Diagnosis present

## 2020-10-16 DIAGNOSIS — K219 Gastro-esophageal reflux disease without esophagitis: Secondary | ICD-10-CM | POA: Diagnosis present

## 2020-10-16 DIAGNOSIS — I5032 Chronic diastolic (congestive) heart failure: Secondary | ICD-10-CM | POA: Diagnosis present

## 2020-10-16 DIAGNOSIS — Z6841 Body Mass Index (BMI) 40.0 and over, adult: Secondary | ICD-10-CM

## 2020-10-16 DIAGNOSIS — Z79899 Other long term (current) drug therapy: Secondary | ICD-10-CM

## 2020-10-16 DIAGNOSIS — I110 Hypertensive heart disease with heart failure: Secondary | ICD-10-CM | POA: Diagnosis present

## 2020-10-16 DIAGNOSIS — F17290 Nicotine dependence, other tobacco product, uncomplicated: Secondary | ICD-10-CM | POA: Diagnosis present

## 2020-10-16 NOTE — ED Provider Notes (Signed)
[facility] DEPT Provider Note   CSN: [id number] Arrival date & time: 10/16/20  2225     History Chief Complaint  Patient presents with   Abscess    William Benitez is a 50 y.o. male.  Patient presents to the emergency department with complaints of a painful area behind his scrotum.  Patient reports that he started noticing the pain approximately 1 week ago.  4 days ago the area started draining.      Past Medical History:  Diagnosis Date   Arthritis    Asthma    CHF (congestive heart failure) ([place])    Diabetes mellitus without complication ([place])    Hypertension    Sleep apnea     Patient Active Problem List   Diagnosis Date Noted   Tingling of both feet 12/21/2018   Cellulitis of foot, left 08/29/2018   Infected cyst of skin 07/04/2018   Type 2 diabetes mellitus without complication ([place]) 27/07/2374   SOB (shortness of breath) 04/24/2018   OSA (obstructive sleep apnea), severe 08/2018 04/24/2018   Bilateral primary osteoarthritis of knee 04/24/2018   Muscle cramping 04/24/2018   GERD (gastroesophageal reflux disease) 04/24/2018   Epigastric pain 04/24/2018   Black stool 04/24/2018   Asthma 04/23/2018   Hyperlipidemia 04/23/2018   Chronic diastolic heart failure ([place])    Hypertension     History reviewed. No pertinent surgical history.     Family History  Problem Relation Age of Onset   Hypertension Mother    Liver disease Mother    Kidney disease Father     Social History   Tobacco Use   Smoking status: Former   Smokeless tobacco: Never  Scientific laboratory technician Use: Never used  Substance Use Topics   Alcohol use: No   Drug use: Not Currently    Types: Marijuana    Comment: occ    Home Medications Prior to Admission medications   Medication Sig Start Date End Date Taking? Authorizing Provider  albuterol (PROVENTIL HFA;VENTOLIN HFA) 108 (90 Base) MCG/ACT inhaler Inhale 1-2 puffs into the lungs every 6 (six) hours as  needed for wheezing or shortness of breath. 04/24/18   Binnie Rail, MD  blood glucose meter kit and supplies KIT Dispense based on patient and insurance preference. Use up to four times daily as directed. (FOR E11.9). 12/21/18   Binnie Rail, MD  carvedilol (COREG) 25 MG tablet Take 1 tablet (25 mg total) by mouth 2 (two) times daily with a meal. 05/31/18   Burns, Claudina Lick, MD  cephALEXin (KEFLEX) 500 MG capsule Take 1 capsule (500 mg total) by mouth 4 (four) times daily. 11/20/19   Tasia Catchings, Amy V, PA-C  ciclesonide (ALVESCO) 160 MCG/ACT inhaler Inhale 1 puff into the lungs 2 (two) times daily.    [provider]  clotrimazole (LOTRIMIN) 1 % cream Apply to affected area 2 times daily 11/20/19   Tasia Catchings, Amy V, PA-C  furosemide (LASIX) 40 MG tablet Take 1 tablet (40 mg total) by mouth 2 (two) times daily. 04/20/18   Montine Circle, PA-C  ibuprofen (ADVIL) 600 MG tablet Take 1 tablet (600 mg total) by mouth every 6 (six) hours as needed for moderate pain. 07/24/20   Domenic Moras, PA-C  losartan (COZAAR) 25 MG tablet Take 1 tablet (25 mg total) by mouth daily. 08/01/18 12/26/18  Elouise Munroe, MD  metFORMIN (GLUCOPHAGE) 500 MG tablet Take 1 tablet (500 mg total) by mouth 2 (two) times daily with  a meal. 05/31/18   Burns, Claudina Lick, MD  methocarbamol (ROBAXIN) 500 MG tablet Take 1 tablet (500 mg total) by mouth 2 (two) times daily. 05/21/20   McDonald, Mia A, PA-C  pantoprazole (PROTONIX) 40 MG tablet Take 1 tablet (40 mg total) by mouth daily. 05/31/18   Binnie Rail, MD  rosuvastatin (CRESTOR) 40 MG tablet Take 1 tablet (40 mg total) by mouth daily. 04/27/18   Binnie Rail, MD  Tiotropium Bromide-Olodaterol (STIOLTO RESPIMAT) 2.5-2.5 MCG/ACT AERS Inhale into the lungs.    [provider]    Allergies    Patient has no known allergies.  Review of Systems   Review of Systems  Skin:  Positive for wound.  All other systems reviewed and are negative.  Physical Exam Updated Vital Signs BP (!)  169/79   Pulse (!) 107   Temp 99.9 F (37.7 C) (Rectal)   Resp 20   Ht 5' 9"  (1.753 m)   Wt (!) 146.5 kg   SpO2 98%   BMI 47.70 kg/m   Physical Exam Vitals and nursing note reviewed.  Constitutional:      General: He is not in acute distress.    Appearance: Normal appearance. He is well-developed.  HENT:     Head: Normocephalic and atraumatic.     Right Ear: Hearing normal.     Left Ear: Hearing normal.     Nose: Nose normal.  Eyes:     Conjunctiva/sclera: Conjunctivae normal.     Pupils: Pupils are equal, round, and reactive to light.  Cardiovascular:     Rate and Rhythm: Regular rhythm. Tachycardia present.     Heart sounds: S1 normal and S2 normal. No murmur heard.   No friction rub. No gallop.  Pulmonary:     Effort: Pulmonary effort is normal. No respiratory distress.     Breath sounds: Normal breath sounds.  Chest:     Chest wall: No tenderness.  Abdominal:     General: Bowel sounds are normal.     Palpations: Abdomen is soft.     Tenderness: There is no abdominal tenderness. There is no guarding or rebound. Negative signs include Murphy's sign and McBurney's sign.     Hernia: No hernia is present.  Genitourinary:    Testes: Normal.        Right: Mass, tenderness or swelling not present.        Left: Mass, tenderness or swelling not present.    Musculoskeletal:        General: Normal range of motion.     Cervical back: Normal range of motion and neck supple.  Skin:    General: Skin is warm and dry.     Findings: Erythema present. No rash.  Neurological:     Mental Status: He is alert and oriented to person, place, and time.     GCS: GCS eye subscore is 4. GCS verbal subscore is 5. GCS motor subscore is 6.     Cranial Nerves: No cranial nerve deficit.     Sensory: No sensory deficit.     Coordination: Coordination normal.  Psychiatric:        Speech: Speech normal.        Behavior: Behavior normal.        Thought Content: Thought content normal.     ED Results / Procedures / Treatments   Labs (all labs ordered are listed, but only abnormal results are displayed) Labs Reviewed  COMPREHENSIVE METABOLIC PANEL - Abnormal; Notable  for the following components:      Result Value   Glucose, Bld 389 (*)    Total Protein 8.3 (*)    All other components within normal limits  LACTIC ACID, PLASMA - Abnormal; Notable for the following components:   Lactic Acid, Venous 2.6 (*)    All other components within normal limits  AEROBIC CULTURE W GRAM STAIN (SUPERFICIAL SPECIMEN)  CULTURE, BLOOD (SINGLE)  CULTURE, BLOOD (SINGLE)  RESP PANEL BY RT-PCR (FLU A&B, COVID) ARPGX2  CBC WITH DIFFERENTIAL/PLATELET  LACTIC ACID, PLASMA    EKG None  Radiology CT ABDOMEN PELVIS W CONTRAST  Result Date: 10/17/2020 CLINICAL DATA:  Abdominal abscess/infection suspected. Periscrotal drainage. EXAM: CT ABDOMEN AND PELVIS WITH CONTRAST TECHNIQUE: Multidetector CT imaging of the abdomen and pelvis was performed using the standard protocol following bolus administration of intravenous contrast. CONTRAST:  144mL OMNIPAQUE IOHEXOL 350 MG/ML SOLN COMPARISON:  05/02/2017 FINDINGS: Lower chest: No acute abnormality. Hepatobiliary: Moderate hepatic steatosis. Wedge like region of increased attenuation within the peripheral right hepatic lobe is most in keeping with focal fatty sparing and appears stable since prior examination. Similar appearing region noted along the interlobar fissure anteriorly. A 24 mm low-attenuation lesion is seen within segment 4 of the liver, image # 22/2. While not seen on prior noncontrast CT examination, this was described on CT examination of 04/20/2018 and appears similar by description. This, however, is indeterminate. No intra or extrahepatic biliary ductal dilation. Gallbladder is unremarkable. Pancreas: Unremarkable Spleen: Unremarkable Adrenals/Urinary Tract: The adrenal glands are unremarkable. The kidneys are normal in size and position.  Simple cortical cyst noted within the upper pole of the right kidney. The kidneys are otherwise unremarkable. The bladder is unremarkable. Stomach/Bowel: Stomach is within normal limits. Appendix appears normal. No evidence of bowel wall thickening, distention, or inflammatory changes. No free intraperitoneal gas or fluid. Vascular/Lymphatic: Mild aortoiliac atherosclerotic calcification. No aortic aneurysm. No pathologic adenopathy within the abdomen and pelvis. Reproductive: Prostate is unremarkable. Other: No abdominal wall hernia. The rectum is unremarkable. Additional images obtained through the perineum demonstrate no periscrotal inflammatory stranding or subcutaneous fluid collection. Musculoskeletal: Left hip ORIF has been performed. No acute bone abnormality. No lytic or blastic bone lesions. IMPRESSION: No periscrotal inflammatory stranding or fluid collection identified. No acute intra-abdominal pathology. No definite radiographic explanation for the patient's reported symptoms. Moderate hepatic steatosis. Scattered areas of probable focal fatty hepatic sparing. Indeterminate 24 mm low-attenuation lesion within the left hepatic lobe. While described on prior examination of 04/20/2018, this is not well characterized on this examination and contrast enhanced MRI would be more helpful for further characterization. Aortic Atherosclerosis (ICD10-I70.0). Electronically Signed   By: Fidela Salisbury M.D.   On: 10/17/2020 02:32    Procedures Procedures   Medications Ordered in ED Medications  lactated ringers infusion ( Intravenous New Bag/Given 10/17/20 0238)  lactated ringers bolus 1,000 mL (1,000 mLs Intravenous New Bag/Given 10/17/20 0238)    And  lactated ringers bolus 1,000 mL (1,000 mLs Intravenous New Bag/Given 10/17/20 0206)    And  lactated ringers bolus 200 mL (200 mLs Intravenous New Bag/Given 10/17/20 0238)  vancomycin (VANCOREADY) IVPB 2000 mg/400 mL ( Intravenous Restarted 10/17/20 0206)   piperacillin-tazobactam (ZOSYN) IVPB 3.375 g (0 g Intravenous Stopped 10/17/20 0135)  vancomycin (VANCOREADY) IVPB 1500 mg/300 mL (has no administration in time range)  sodium chloride 0.9 % bolus 1,000 mL (0 mLs Intravenous Stopped 10/17/20 0239)  acetaminophen (TYLENOL) tablet 650 mg (650 mg Oral Given 10/17/20 0115)  iohexol (  OMNIPAQUE) 350 MG/ML injection 100 mL (100 mLs Intravenous Contrast Given 10/17/20 0145)  LORazepam (ATIVAN) injection 1 mg (1 mg Intravenous Given 10/17/20 0140)    ED Course  I have reviewed the triage vital signs and the nursing notes.  Pertinent labs & imaging results that were available during my care of the patient were reviewed by me and considered in my medical decision making (see chart for details).    MDM Rules/Calculators/A&P                           Patient presents to the emergency department for evaluation of a painful area behind his scrotum.  Examination reveals an open draining area just posterior to the perineum on the left buttock.  No significant surrounding induration.  Patient noted to be tachycardic at arrival.  Rectal temp 99.9, not quite a fever but perhaps elevated.  He does not have a leukocytosis but his lactic acid is elevated at 2.6.  When his lactic acid resulted, confirmed multiple SIRS criteria, initiated sepsis order set.  Patient administered IV fluids, broad-spectrum antibiotics.  CT scan does not show any drainable fluid collections.  No indication for surgical intervention at this time.  No findings to support diagnosis of Fournier's gangrene, perirectal abscess or deep pelvic infection.  Based on his elevated lactic acid, tachycardia, positive SIRS criteria, however will admit.  CRITICAL CARE Performed by: Orpah Greek   Total critical care time: 33 minutes  Critical care time was exclusive of separately billable procedures and treating other patients.  Critical care was necessary to treat or prevent imminent or  life-threatening deterioration.  Critical care was time spent personally by me on the following activities: development of treatment plan with patient and/or surrogate as well as nursing, discussions with consultants, evaluation of patient's response to treatment, examination of patient, obtaining history from patient or surrogate, ordering and performing treatments and interventions, ordering and review of laboratory studies, ordering and review of radiographic studies, pulse oximetry and re-evaluation of patient's condition.   Final Clinical Impression(s) / ED Diagnoses Final diagnoses:  Abscess of buttock, left  Cellulitis of buttock    Rx / DC Orders ED Discharge Orders     None        Myquan Schaumburg, Gwenyth Allegra, MD 10/17/20 0300

## 2020-10-16 NOTE — ED Triage Notes (Signed)
Pt reports a painful area behind his scrotum, more on the left side. Possible abscess. Appeared 1 week ago. Also reports drainage from area.

## 2020-10-17 ENCOUNTER — Encounter (HOSPITAL_COMMUNITY): Payer: Self-pay

## 2020-10-17 ENCOUNTER — Emergency Department (HOSPITAL_COMMUNITY): Payer: PRIVATE HEALTH INSURANCE

## 2020-10-17 DIAGNOSIS — Z79899 Other long term (current) drug therapy: Secondary | ICD-10-CM | POA: Diagnosis not present

## 2020-10-17 DIAGNOSIS — G4733 Obstructive sleep apnea (adult) (pediatric): Secondary | ICD-10-CM | POA: Diagnosis present

## 2020-10-17 DIAGNOSIS — K219 Gastro-esophageal reflux disease without esophagitis: Secondary | ICD-10-CM | POA: Diagnosis present

## 2020-10-17 DIAGNOSIS — Z8249 Family history of ischemic heart disease and other diseases of the circulatory system: Secondary | ICD-10-CM | POA: Diagnosis not present

## 2020-10-17 DIAGNOSIS — Z841 Family history of disorders of kidney and ureter: Secondary | ICD-10-CM | POA: Diagnosis not present

## 2020-10-17 DIAGNOSIS — F17290 Nicotine dependence, other tobacco product, uncomplicated: Secondary | ICD-10-CM | POA: Diagnosis present

## 2020-10-17 DIAGNOSIS — B954 Other streptococcus as the cause of diseases classified elsewhere: Secondary | ICD-10-CM | POA: Diagnosis present

## 2020-10-17 DIAGNOSIS — E785 Hyperlipidemia, unspecified: Secondary | ICD-10-CM | POA: Diagnosis present

## 2020-10-17 DIAGNOSIS — L03317 Cellulitis of buttock: Principal | ICD-10-CM

## 2020-10-17 DIAGNOSIS — E1165 Type 2 diabetes mellitus with hyperglycemia: Secondary | ICD-10-CM

## 2020-10-17 DIAGNOSIS — Z7984 Long term (current) use of oral hypoglycemic drugs: Secondary | ICD-10-CM | POA: Diagnosis not present

## 2020-10-17 DIAGNOSIS — E860 Dehydration: Secondary | ICD-10-CM | POA: Diagnosis present

## 2020-10-17 DIAGNOSIS — Z9119 Patient's noncompliance with other medical treatment and regimen: Secondary | ICD-10-CM | POA: Diagnosis not present

## 2020-10-17 DIAGNOSIS — J45909 Unspecified asthma, uncomplicated: Secondary | ICD-10-CM | POA: Diagnosis present

## 2020-10-17 DIAGNOSIS — Z20822 Contact with and (suspected) exposure to covid-19: Secondary | ICD-10-CM | POA: Diagnosis present

## 2020-10-17 DIAGNOSIS — L02211 Cutaneous abscess of abdominal wall: Secondary | ICD-10-CM | POA: Diagnosis present

## 2020-10-17 DIAGNOSIS — I5032 Chronic diastolic (congestive) heart failure: Secondary | ICD-10-CM | POA: Diagnosis present

## 2020-10-17 DIAGNOSIS — I110 Hypertensive heart disease with heart failure: Secondary | ICD-10-CM | POA: Diagnosis present

## 2020-10-17 DIAGNOSIS — Z6841 Body Mass Index (BMI) 40.0 and over, adult: Secondary | ICD-10-CM | POA: Diagnosis not present

## 2020-10-17 DIAGNOSIS — E872 Acidosis: Secondary | ICD-10-CM | POA: Diagnosis present

## 2020-10-17 DIAGNOSIS — L0231 Cutaneous abscess of buttock: Secondary | ICD-10-CM | POA: Diagnosis present

## 2020-10-17 LAB — CBC WITH DIFFERENTIAL/PLATELET
Abs Immature Granulocytes: 0.04 10*3/uL (ref 0.00–0.07)
Basophils Absolute: 0.1 10*3/uL (ref 0.0–0.1)
Basophils Relative: 1 %
Eosinophils Absolute: 0.1 10*3/uL (ref 0.0–0.5)
Eosinophils Relative: 1 %
HCT: 43.6 % (ref 39.0–52.0)
Hemoglobin: 14.1 g/dL (ref 13.0–17.0)
Immature Granulocytes: 1 %
Lymphocytes Relative: 32 %
Lymphs Abs: 2.4 10*3/uL (ref 0.7–4.0)
MCH: 27.3 pg (ref 26.0–34.0)
MCHC: 32.3 g/dL (ref 30.0–36.0)
MCV: 84.3 fL (ref 80.0–100.0)
Monocytes Absolute: 0.7 10*3/uL (ref 0.1–1.0)
Monocytes Relative: 9 %
Neutro Abs: 4.3 10*3/uL (ref 1.7–7.7)
Neutrophils Relative %: 56 %
Platelets: 291 10*3/uL (ref 150–400)
RBC: 5.17 MIL/uL (ref 4.22–5.81)
RDW: 13.8 % (ref 11.5–15.5)
WBC: 7.7 10*3/uL (ref 4.0–10.5)
nRBC: 0 % (ref 0.0–0.2)

## 2020-10-17 LAB — COMPREHENSIVE METABOLIC PANEL
ALT: 25 U/L (ref 0–44)
AST: 19 U/L (ref 15–41)
Albumin: 3.7 g/dL (ref 3.5–5.0)
Alkaline Phosphatase: 76 U/L (ref 38–126)
Anion gap: 9 (ref 5–15)
BUN: 14 mg/dL (ref 6–20)
CO2: 26 mmol/L (ref 22–32)
Calcium: 9.3 mg/dL (ref 8.9–10.3)
Chloride: 100 mmol/L (ref 98–111)
Creatinine, Ser: 0.95 mg/dL (ref 0.61–1.24)
GFR, Estimated: >60 mL/min (ref >60)
Glucose, Bld: 389 mg/dL — ABNORMAL HIGH (ref 70–99)
Potassium: 4.2 mmol/L (ref 3.5–5.1)
Sodium: 135 mmol/L (ref 135–145)
Total Bilirubin: 0.4 mg/dL (ref 0.3–1.2)
Total Protein: 8.3 g/dL — ABNORMAL HIGH (ref 6.5–8.1)

## 2020-10-17 LAB — CBC
HCT: 39.2 % (ref 39.0–52.0)
Hemoglobin: 12.5 g/dL — ABNORMAL LOW (ref 13.0–17.0)
MCH: 27.2 pg (ref 26.0–34.0)
MCHC: 31.9 g/dL (ref 30.0–36.0)
MCV: 85.2 fL (ref 80.0–100.0)
Platelets: 251 10*3/uL (ref 150–400)
RBC: 4.6 MIL/uL (ref 4.22–5.81)
RDW: 13.8 % (ref 11.5–15.5)
WBC: 8.4 10*3/uL (ref 4.0–10.5)
nRBC: 0 % (ref 0.0–0.2)

## 2020-10-17 LAB — BASIC METABOLIC PANEL
Anion gap: 7 (ref 5–15)
BUN: 13 mg/dL (ref 6–20)
CO2: 26 mmol/L (ref 22–32)
Calcium: 8.7 mg/dL — ABNORMAL LOW (ref 8.9–10.3)
Chloride: 105 mmol/L (ref 98–111)
Creatinine, Ser: 0.89 mg/dL (ref 0.61–1.24)
GFR, Estimated: >60 mL/min (ref >60)
Glucose, Bld: 261 mg/dL — ABNORMAL HIGH (ref 70–99)
Potassium: 4 mmol/L (ref 3.5–5.1)
Sodium: 138 mmol/L (ref 135–145)

## 2020-10-17 LAB — GLUCOSE, CAPILLARY: Glucose-Capillary: 190 mg/dL — ABNORMAL HIGH (ref 70–99)

## 2020-10-17 LAB — LACTIC ACID, PLASMA
Lactic Acid, Venous: 1.9 mmol/L (ref 0.5–1.9)
Lactic Acid, Venous: 2.6 mmol/L (ref 0.5–1.9)

## 2020-10-17 LAB — RESP PANEL BY RT-PCR (FLU A&B, COVID) ARPGX2
Influenza A by PCR: NEGATIVE
Influenza B by PCR: NEGATIVE
SARS Coronavirus 2 by RT PCR: NEGATIVE

## 2020-10-17 LAB — HEMOGLOBIN A1C
Hgb A1c MFr Bld: 11.9 % — ABNORMAL HIGH (ref 4.8–5.6)
Mean Plasma Glucose: 294.83 mg/dL

## 2020-10-17 LAB — HIV ANTIBODY (ROUTINE TESTING W REFLEX): HIV Screen 4th Generation wRfx: NONREACTIVE

## 2020-10-17 LAB — CBG MONITORING, ED
Glucose-Capillary: 212 mg/dL — ABNORMAL HIGH (ref 70–99)
Glucose-Capillary: 253 mg/dL — ABNORMAL HIGH (ref 70–99)

## 2020-10-17 MED ORDER — LACTATED RINGERS IV SOLN: INTRAVENOUS | Status: DC

## 2020-10-17 MED ORDER — UMECLIDINIUM BROMIDE 62.5 MCG/INH IN AEPB
1.0000 | INHALATION_SPRAY | Freq: Every day | RESPIRATORY_TRACT | Status: DC
  Administered 2020-10-18 – 2020-10-20 (×3): 1 via RESPIRATORY_TRACT
  Filled 2020-10-17: qty 7

## 2020-10-17 MED ORDER — CARVEDILOL 25 MG PO TABS
25.0000 mg | ORAL_TABLET | Freq: Two times a day (BID) | ORAL | Status: DC
  Administered 2020-10-17 – 2020-10-20 (×8): 25 mg via ORAL
  Filled 2020-10-17 (×4): qty 1
  Filled 2020-10-17: qty 2
  Filled 2020-10-17 (×3): qty 1

## 2020-10-17 MED ORDER — LORAZEPAM 2 MG/ML IJ SOLN
INTRAMUSCULAR | Status: AC
  Administered 2020-10-17: 1 mg via INTRAVENOUS
  Filled 2020-10-17: qty 1

## 2020-10-17 MED ORDER — FUROSEMIDE 40 MG PO TABS
40.0000 mg | ORAL_TABLET | Freq: Two times a day (BID) | ORAL | Status: DC
  Administered 2020-10-17 – 2020-10-20 (×8): 40 mg via ORAL
  Filled 2020-10-17 (×8): qty 1

## 2020-10-17 MED ORDER — HYDROCODONE-ACETAMINOPHEN 5-325 MG PO TABS
1.0000 | ORAL_TABLET | Freq: Four times a day (QID) | ORAL | Status: DC | PRN
Start: 2020-10-17 — End: 2020-10-20
  Administered 2020-10-17 – 2020-10-20 (×8): 1 via ORAL
  Filled 2020-10-17 (×9): qty 1

## 2020-10-17 MED ORDER — VANCOMYCIN HCL IN DEXTROSE 1-5 GM/200ML-% IV SOLN
1000.0000 mg | Freq: Once | INTRAVENOUS | Status: DC
  Filled 2020-10-17: qty 200

## 2020-10-17 MED ORDER — CICLESONIDE 160 MCG/ACT IN AERS: 1.0000 | INHALATION_SPRAY | Freq: Two times a day (BID) | RESPIRATORY_TRACT | Status: DC

## 2020-10-17 MED ORDER — VANCOMYCIN HCL 2000 MG/400ML IV SOLN
2000.0000 mg | Freq: Once | INTRAVENOUS | Status: AC
  Administered 2020-10-17: 2000 mg via INTRAVENOUS
  Filled 2020-10-17: qty 400

## 2020-10-17 MED ORDER — LACTATED RINGERS IV BOLUS (SEPSIS)
1000.0000 mL | Freq: Once | INTRAVENOUS | Status: AC
  Administered 2020-10-17: 1000 mL via INTRAVENOUS

## 2020-10-17 MED ORDER — BUDESONIDE 0.25 MG/2ML IN SUSP
0.2500 mg | Freq: Two times a day (BID) | RESPIRATORY_TRACT | Status: DC
  Administered 2020-10-17 – 2020-10-20 (×7): 0.25 mg via RESPIRATORY_TRACT
  Filled 2020-10-17 (×8): qty 2

## 2020-10-17 MED ORDER — INSULIN ASPART 100 UNIT/ML IJ SOLN
0.0000 [IU] | Freq: Three times a day (TID) | INTRAMUSCULAR | Status: DC
  Administered 2020-10-17: 7 [IU] via SUBCUTANEOUS
  Administered 2020-10-17: 4 [IU] via SUBCUTANEOUS
  Administered 2020-10-17: 11 [IU] via SUBCUTANEOUS
  Administered 2020-10-18 (×2): 7 [IU] via SUBCUTANEOUS
  Administered 2020-10-18 – 2020-10-19 (×2): 4 [IU] via SUBCUTANEOUS
  Administered 2020-10-19 (×2): 7 [IU] via SUBCUTANEOUS
  Administered 2020-10-20: 3 [IU] via SUBCUTANEOUS
  Administered 2020-10-20: 7 [IU] via SUBCUTANEOUS
  Administered 2020-10-20: 4 [IU] via SUBCUTANEOUS
  Filled 2020-10-17: qty 0.2

## 2020-10-17 MED ORDER — PIPERACILLIN-TAZOBACTAM 3.375 G IVPB
3.3750 g | Freq: Three times a day (TID) | INTRAVENOUS | Status: DC
  Administered 2020-10-17 – 2020-10-19 (×8): 3.375 g via INTRAVENOUS
  Filled 2020-10-17 (×9): qty 50

## 2020-10-17 MED ORDER — ARFORMOTEROL TARTRATE 15 MCG/2ML IN NEBU
15.0000 ug | INHALATION_SOLUTION | Freq: Two times a day (BID) | RESPIRATORY_TRACT | Status: DC
Start: 2020-10-17 — End: 2020-10-20
  Administered 2020-10-17 – 2020-10-20 (×6): 15 ug via RESPIRATORY_TRACT
  Filled 2020-10-17 (×8): qty 2

## 2020-10-17 MED ORDER — ONDANSETRON HCL 4 MG PO TABS: 4.0000 mg | ORAL_TABLET | Freq: Four times a day (QID) | ORAL | Status: DC | PRN

## 2020-10-17 MED ORDER — ALBUTEROL SULFATE (2.5 MG/3ML) 0.083% IN NEBU: 2.5000 mg | INHALATION_SOLUTION | RESPIRATORY_TRACT | Status: DC | PRN

## 2020-10-17 MED ORDER — ONDANSETRON HCL 4 MG/2ML IJ SOLN: 4.0000 mg | Freq: Four times a day (QID) | INTRAMUSCULAR | Status: DC | PRN

## 2020-10-17 MED ORDER — ENOXAPARIN SODIUM 80 MG/0.8ML IJ SOSY
70.0000 mg | PREFILLED_SYRINGE | INTRAMUSCULAR | Status: DC
  Administered 2020-10-17 – 2020-10-20 (×4): 70 mg via SUBCUTANEOUS
  Filled 2020-10-17: qty 0.8
  Filled 2020-10-17: qty 0.7
  Filled 2020-10-17 (×2): qty 0.8

## 2020-10-17 MED ORDER — INSULIN ASPART 100 UNIT/ML IJ SOLN
0.0000 [IU] | Freq: Every day | INTRAMUSCULAR | Status: DC
  Administered 2020-10-17 – 2020-10-19 (×3): 3 [IU] via SUBCUTANEOUS
  Filled 2020-10-17: qty 0.05

## 2020-10-17 MED ORDER — SODIUM CHLORIDE 0.9 % IV BOLUS
1000.0000 mL | Freq: Once | INTRAVENOUS | Status: AC
  Administered 2020-10-17: 1000 mL via INTRAVENOUS

## 2020-10-17 MED ORDER — IOHEXOL 350 MG/ML SOLN
100.0000 mL | Freq: Once | INTRAVENOUS | Status: AC | PRN
  Administered 2020-10-17: 100 mL via INTRAVENOUS

## 2020-10-17 MED ORDER — ACETAMINOPHEN 325 MG PO TABS
650.0000 mg | ORAL_TABLET | Freq: Four times a day (QID) | ORAL | Status: DC | PRN
  Administered 2020-10-17: 650 mg via ORAL

## 2020-10-17 MED ORDER — VANCOMYCIN HCL 1500 MG/300ML IV SOLN
1500.0000 mg | Freq: Two times a day (BID) | INTRAVENOUS | Status: DC
  Administered 2020-10-17 – 2020-10-20 (×7): 1500 mg via INTRAVENOUS
  Filled 2020-10-17 (×7): qty 300

## 2020-10-17 MED ORDER — IBUPROFEN 200 MG PO TABS: 600.0000 mg | ORAL_TABLET | Freq: Four times a day (QID) | ORAL | Status: DC | PRN

## 2020-10-17 MED ORDER — ACETAMINOPHEN 325 MG PO TABS
650.0000 mg | ORAL_TABLET | Freq: Once | ORAL | Status: AC
  Administered 2020-10-17: 650 mg via ORAL
  Filled 2020-10-17: qty 2

## 2020-10-17 MED ORDER — LACTATED RINGERS IV BOLUS (SEPSIS)
200.0000 mL | Freq: Once | INTRAVENOUS | Status: AC
  Administered 2020-10-17: 200 mL via INTRAVENOUS

## 2020-10-17 MED ORDER — ACETAMINOPHEN 650 MG RE SUPP: 650.0000 mg | Freq: Four times a day (QID) | RECTAL | Status: DC | PRN

## 2020-10-17 MED ORDER — LOSARTAN POTASSIUM 25 MG PO TABS
25.0000 mg | ORAL_TABLET | Freq: Every day | ORAL | Status: DC
  Administered 2020-10-17 – 2020-10-20 (×4): 25 mg via ORAL
  Filled 2020-10-17 (×4): qty 1

## 2020-10-17 MED ORDER — ROSUVASTATIN CALCIUM 20 MG PO TABS
40.0000 mg | ORAL_TABLET | Freq: Every day | ORAL | Status: DC
  Administered 2020-10-17 – 2020-10-20 (×4): 40 mg via ORAL
  Filled 2020-10-17 (×4): qty 2

## 2020-10-17 MED ORDER — LORAZEPAM 2 MG/ML IJ SOLN: 1.0000 mg | Freq: Once | INTRAMUSCULAR | Status: AC

## 2020-10-17 NOTE — ED Notes (Signed)
Called Ariez Neilan and gave her an update.

## 2020-10-17 NOTE — Sepsis Progress Note (Signed)
Code Sepsis Follow Up:  Per protocol, pt second LA is due. Pt is in CT per ED RN and has not received his entire IVF boluses. Informed RN that the results will be monitored.   Komal Stangelo DNP Pola Corn RN 0159 AM

## 2020-10-17 NOTE — ED Notes (Signed)
William Benitez [phone] patients wife would like a call back with a follow up on her husband

## 2020-10-17 NOTE — Progress Notes (Signed)
Patient seen and examined personally, I reviewed the chart, history and physical and admission note, done by admitting physician this morning and agree with the same with following addendum.  Please refer to the morning admission note for more detailed plan of care.  Briefly, 50 year old male with hypertension diabetes hyperlipidemia, obesity, OSA, history of CHF presents with 1 week history of pain on the left buttock that had been draining He smokes a few mini cigars every 2 to 3 days.  Denies alcohol or illicit drug use In the ED hypertensive lactic acidosis draining area with surrounding erythema  CT scan of the abdomen pelvis was obtained which showed no intra-abdominal abscess and there is no abscess identified in the buttocks and no signs of forneirs gangrene.  CBC is unremarkable.  Initial lactic acid was 2.6.  After IV fluid hydration lactic acid decreased to 1.9.  Sodium 135 potassium 4.2 chloride 100 bicarb 26 creatinine 0.95 BUN 14 calcium 9.3 AST 19 ALT 25 alk phos a 76 glucose 389.  COVID swab is pending.  Patient placed on vancomycin in the emergency room.  Hospitalist service been asked to admit for further management"  Seen this morning, complains of pain in the left buttock no fever chills nausea vomiting or abdominal pain  issues  Cellulitis of buttock: Without induration patient does endorses drainage at home.  CT abdomen no abscess or fluid collection.Continue current IV antibiotics currently medically stable no leukocytosis.   Lactic acidosis suspect multifactorial due to hyperglycemia and dehydration also due to cellulitis.  Resolved with IV fluid hydration in the ED.  HTN: Continues Lasix Coreg, losartan  HLD on Crestor  DM uncontrolled, metformin at home on list.  A1c poorly controlled 11.9 8/26. Cont ssi, may need long acting insulin, will monitor. Last cbg at 389> 261. No results for input(s): GLUCAP in the last 168 hours.   Asthma-not wheezing.  Continue his home  inhaler-brovana-incruse ellipta Class III obesity with BMI 47 OSA-continue CPAP

## 2020-10-17 NOTE — ED Notes (Signed)
Patient took off CPAP. Patient said "I don't want that right now, maybe later." RN educated patient on benefits of CPAP while sleeping. Patient still refused.

## 2020-10-17 NOTE — Progress Notes (Signed)
Inpatient Diabetes Program Recommendations  AACE/ADA: New Consensus Statement on Inpatient Glycemic Control (2015)  Target Ranges:  Prepandial:   less than 140 mg/dL      Peak postprandial:   less than 180 mg/dL (1-2 hours)      Critically ill patients:  140 - 180 mg/dL   Lab Results  Component Value Date   GLUCAP 253 (H) 10/17/2020   HGBA1C 11.9 (H) 10/17/2020    Review of Glycemic Control  Diabetes history: DM 2 Outpatient Diabetes medications: Metformin 500 mg bid, Jardiance (1/2 tablet) Daily Current orders for Inpatient glycemic control:  Novolog 0-20 units tid + hs  Inpatient Diabetes Program Recommendations:    -  Consider starting Semglee 25 units  Spoke with pt and sister at bedside regarding A1c (11.9%) and possibly needing insulin at time of d/c. Pt took care of his grandmother with diabetes in the past. Went through how to operate insulin pen, when, where to inject. Discussed the type of insulin he maybe prescribed (long acting). Described hypoglycemia s/s and treatment. Discussed heavily lifestyle modifications and exercise. Per report pt does not follow a DM diet and is not active. Pt sees PCP for DM management and sees him every 2-3 months but does not check up on DM level all the time. Pt reports not checking glucose at home. Told pt to check glucose fasting and alternating second check.  D/c: Lantus solostar insulin pen order # [id number] Insulin pen needles order [phone]  Pt has meter at home  Thanks,  Christena Deem RN, MSN, BC-ADM Inpatient Diabetes Coordinator Team Pager [phone] (8a-5p)

## 2020-10-17 NOTE — Progress Notes (Signed)
Pharmacy Antibiotic Note  William Benitez is a 50 y.o. male admitted on 10/16/2020 with evaluation of a painful area behind his scrotum.  Examination reveals an open draining area just posterior to the perineum on the left buttock.  Pharmacy has been consulted for vancomycin and zosyn dosing.  Plan: Vancomycin 2gm IV x 1 then 1500 q12h (AUC 486.5, Scr 0.95) Zosyn 3.375g IV Q8H infused over 4hrs. Daily Scr Follow renal function and clinical course  Height: 5\' 9"  (175.3 cm) Weight: (!) 146.5 kg (323 lb) IBW/kg (Calculated) : 70.7  Temp (24hrs), Avg:99.6 F (37.6 C), Min:99.3 F (37.4 C), Max:99.9 F (37.7 C)  Recent Labs  Lab 10/16/20 2349  WBC 7.7  CREATININE 0.95  LATICACIDVEN 2.6*    Estimated Creatinine Clearance: 137.3 mL/min (by C-G formula based on SCr of 0.95 mg/dL).    No Known Allergies  Antimicrobials this admission: Vanc 8/26 >> Zosyn 8/26 >>  Dose adjustments this admission:   Microbiology results: 8/25 BCx:   Thank you for allowing pharmacy to be a part of this patient's care.  9/25 RPh 10/17/2020, 1:17 AM

## 2020-10-17 NOTE — Sepsis Progress Note (Signed)
Code Sepsis initiated @ 0104 AM, [facility] following.

## 2020-10-17 NOTE — ED Notes (Signed)
Patient transported to CT 

## 2020-10-17 NOTE — Progress Notes (Signed)
CPAP taken to Pt room and setup but Pt stated that he does not want to wear it at this time.  Pt encouraged to contact RT should he change his mind.  Pt informed that machine will follow him up to his room.

## 2020-10-17 NOTE — ED Notes (Signed)
Patient given diet ginger ale.

## 2020-10-17 NOTE — H&P (Signed)
History and Physical    William Benitez IRJ:[id number] DOB: [birth year] DOA: 10/16/2020  PCP: Binnie Rail, MD   Patient coming from: Home  Chief Complaint: painful area on buttock  HPI: William Benitez is a 50 y.o. male with medical history significant for HTN, DMT2, OSA, asthma, CHF, OA who presents for 1 week history of a sore area on the left side of his buttocks that has been draining.  States he has not had any fevers or chills.  He does not remember having any injury or insect bites before the symptoms started.  He reports in the last few days has become more tender and he is sitting or walking and he has had increased drainage over the last 3 to 4 days.  He reports he has not been checking his blood sugars for the last week.  Reports he is on Jardiance for his diabetes although this is not on his medication list.  States he has not had any abdominal pain, nausea vomiting or diarrhea.  He denies any shortness of breath, wheezing, cough, chest pain or palpitations. He smokes a few mini cigars every 2 to 3 days.  Denies alcohol or illicit drug use  ED Course: He has had elevated blood pressure in the emergency room but has been hemodynamically stable.  He does not appear toxic and is alert and oriented and answering all questions appropriately.  All draining area with surrounding erythema on the left buttocks CT scan of the abdomen pelvis was obtained which showed no intra-abdominal abscess and there is no abscess identified in the buttocks and no signs of forneirs gangrene.  CBC is unremarkable.  Initial lactic acid was 2.6.  After IV fluid hydration lactic acid decreased to 1.9.  Sodium 135 potassium 4.2 chloride 100 bicarb 26 creatinine 0.95 BUN 14 calcium 9.3 AST 19 ALT 25 alk phos a 76 glucose 389.  COVID swab is pending.  Patient placed on vancomycin in the emergency room.  Hospitalist service been asked to admit for further management  Review of Systems:  General: Denies fever, chills, weight  loss, night sweats.  Denies dizziness.  Denies change in appetite HENT: Denies head trauma, headache, denies change in hearing, tinnitus.  Denies nasal congestion  Denies sore throat  Denies difficulty swallowing Eyes: Denies blurry vision, pain in eye, drainage.  Denies discoloration of eyes. Neck: Denies pain.  Denies swelling.  Denies pain with movement. Cardiovascular: Denies chest pain, palpitations.  Denies edema.  Denies orthopnea Respiratory: Denies shortness of breath, cough.  Denies wheezing.  Denies sputum production Gastrointestinal: Denies abdominal pain, swelling.  Denies nausea, vomiting, diarrhea.  Denies melena.  Denies hematemesis. Musculoskeletal: Denies limitation of movement.  Denies deformity or swelling. Denies arthralgias Genitourinary: Denies pelvic pain.  Denies urinary frequency or hesitancy.  Denies dysuria.  Skin: Denies rash.  Denies petechiae, purpura, ecchymosis. Sore with drainage on buttock. Neurological:  Denies syncope.  Denies seizure activity.  Denies paresthesia.  Denies slurred speech, drooping face.  Denies visual change. Psychiatric: Denies depression, anxiety. Denies hallucinations.  Past Medical History:  Diagnosis Date   Arthritis    Asthma    CHF (congestive heart failure) ([place])    Diabetes mellitus without complication ([place])    Hypertension    Sleep apnea     History reviewed. No pertinent surgical history.  Social History  reports that he has quit smoking. He has never used smokeless tobacco. He reports that he does not currently use drugs after having used the following  drugs: Marijuana. He reports that he does not drink alcohol.  No Known Allergies  Family History  Problem Relation Age of Onset   Hypertension Mother    Liver disease Mother    Kidney disease Father      Prior to Admission medications   Medication Sig Start Date End Date Taking? Authorizing Provider  carvedilol (COREG) 25 MG tablet Take 1 tablet (25 mg total) by  mouth 2 (two) times daily with a meal. 05/31/18  Yes Burns, Claudina Lick, MD  albuterol (PROVENTIL HFA;VENTOLIN HFA) 108 (90 Base) MCG/ACT inhaler Inhale 1-2 puffs into the lungs every 6 (six) hours as needed for wheezing or shortness of breath. 04/24/18   Binnie Rail, MD  blood glucose meter kit and supplies KIT Dispense based on patient and insurance preference. Use up to four times daily as directed. (FOR E11.9). 12/21/18   Binnie Rail, MD  cephALEXin (KEFLEX) 500 MG capsule Take 1 capsule (500 mg total) by mouth 4 (four) times daily. 11/20/19   Tasia Catchings, Amy V, PA-C  ciclesonide (ALVESCO) 160 MCG/ACT inhaler Inhale 1 puff into the lungs 2 (two) times daily.    [provider]  clotrimazole (LOTRIMIN) 1 % cream Apply to affected area 2 times daily 11/20/19   Tasia Catchings, Amy V, PA-C  furosemide (LASIX) 40 MG tablet Take 1 tablet (40 mg total) by mouth 2 (two) times daily. 04/20/18   Montine Circle, PA-C  ibuprofen (ADVIL) 600 MG tablet Take 1 tablet (600 mg total) by mouth every 6 (six) hours as needed for moderate pain. 07/24/20   Domenic Moras, PA-C  losartan (COZAAR) 25 MG tablet Take 1 tablet (25 mg total) by mouth daily. 08/01/18 12/26/18  Elouise Munroe, MD  metFORMIN (GLUCOPHAGE) 500 MG tablet Take 1 tablet (500 mg total) by mouth 2 (two) times daily with a meal. 05/31/18   Burns, Claudina Lick, MD  methocarbamol (ROBAXIN) 500 MG tablet Take 1 tablet (500 mg total) by mouth 2 (two) times daily. 05/21/20   McDonald, Mia A, PA-C  pantoprazole (PROTONIX) 40 MG tablet Take 1 tablet (40 mg total) by mouth daily. 05/31/18   Binnie Rail, MD  rosuvastatin (CRESTOR) 40 MG tablet Take 1 tablet (40 mg total) by mouth daily. 04/27/18   Binnie Rail, MD  Tiotropium Bromide-Olodaterol (STIOLTO RESPIMAT) 2.5-2.5 MCG/ACT AERS Inhale into the lungs.    [provider]    Physical Exam: Vitals:   10/17/20 0116 10/17/20 0130 10/17/20 0205 10/17/20 0300  BP: 136/77 (!) 147/85 (!) 169/79 (!) 172/82  Pulse: (!) 102 97  (!) 107 (!) 106  Resp:  20 20 20   Temp:      TempSrc:      SpO2: 98% 97% 98% 97%  Weight:      Height:        Constitutional: NAD, calm, comfortable Vitals:   10/17/20 0116 10/17/20 0130 10/17/20 0205 10/17/20 0300  BP: 136/77 (!) 147/85 (!) 169/79 (!) 172/82  Pulse: (!) 102 97 (!) 107 (!) 106  Resp:  20 20 20   Temp:      TempSrc:      SpO2: 98% 97% 98% 97%  Weight:      Height:       General: WDWN, Alert and oriented x3.  Eyes: EOMI, PERRL, conjunctivae normal.  Sclera nonicteric HENT:  [place]/AT, external ears normal.  Nares patent without epistasis.  Mucous membranes are moist. Neck: Soft, normal range of motion, supple, no masses, Trachea midline Respiratory:  clear to auscultation bilaterally, no wheezing, no crackles. Normal respiratory effort. No accessory muscle use.  Cardiovascular: Regular rate and rhythm, no murmurs / rubs / gallops. No extremity edema. Abdomen: Soft, no tenderness, nondistended, no rebound or guarding. No masses palpated. Morbidly obese. Bowel sounds normoactive Musculoskeletal: FROM. No cyanosis. No joint deformity upper and lower extremities. Normal muscle tone.  Skin: Warm, dry, intact no rashes, lesions, ulcers. No induration. Left buttock with small area of erythema with central opening with mild drainage. Mild induration. No fluctuation.  Neurologic: CN 2-12 grossly intact.  Normal speech. Sensation intact to touch. Strength 5/5 in all extremities.   Psychiatric: Normal judgment and insight.  Normal mood.    Labs on Admission: I have personally reviewed following labs and imaging studies  CBC: Recent Labs  Lab 10/16/20 2349  WBC 7.7  NEUTROABS 4.3  HGB 14.1  HCT 43.6  MCV 84.3  PLT 161    Basic Metabolic Panel: Recent Labs  Lab 10/16/20 2349  NA 135  K 4.2  CL 100  CO2 26  GLUCOSE 389*  BUN 14  CREATININE 0.95  CALCIUM 9.3    GFR: Estimated Creatinine Clearance: 137.3 mL/min (by C-G formula based on SCr of 0.95  mg/dL).  Liver Function Tests: Recent Labs  Lab 10/16/20 2349  AST 19  ALT 25  ALKPHOS 76  BILITOT 0.4  PROT 8.3*  ALBUMIN 3.7    Urine analysis:    Component Value Date/Time   COLORURINE YELLOW 05/01/2017 1919   APPEARANCEUR CLEAR 05/01/2017 1919   LABSPEC 1.023 05/01/2017 1919   PHURINE 5.0 05/01/2017 1919   GLUCOSEU NEGATIVE 05/01/2017 1919   HGBUR NEGATIVE 05/01/2017 [place] NEGATIVE 05/01/2017 [place] NEGATIVE 05/01/2017 1919   PROTEINUR NEGATIVE 05/01/2017 1919   NITRITE NEGATIVE 05/01/2017 1919   LEUKOCYTESUR NEGATIVE 05/01/2017 1919    Radiological Exams on Admission: CT ABDOMEN PELVIS W CONTRAST  Result Date: 10/17/2020 CLINICAL DATA:  Abdominal abscess/infection suspected. Periscrotal drainage. EXAM: CT ABDOMEN AND PELVIS WITH CONTRAST TECHNIQUE: Multidetector CT imaging of the abdomen and pelvis was performed using the standard protocol following bolus administration of intravenous contrast. CONTRAST:  167m OMNIPAQUE IOHEXOL 350 MG/ML SOLN COMPARISON:  05/02/2017 FINDINGS: Lower chest: No acute abnormality. Hepatobiliary: Moderate hepatic steatosis. Wedge like region of increased attenuation within the peripheral right hepatic lobe is most in keeping with focal fatty sparing and appears stable since prior examination. Similar appearing region noted along the interlobar fissure anteriorly. A 24 mm low-attenuation lesion is seen within segment 4 of the liver, image # 22/2. While not seen on prior noncontrast CT examination, this was described on CT examination of 04/20/2018 and appears similar by description. This, however, is indeterminate. No intra or extrahepatic biliary ductal dilation. Gallbladder is unremarkable. Pancreas: Unremarkable Spleen: Unremarkable Adrenals/Urinary Tract: The adrenal glands are unremarkable. The kidneys are normal in size and position. Simple cortical cyst noted within the upper pole of the right kidney. The kidneys are  otherwise unremarkable. The bladder is unremarkable. Stomach/Bowel: Stomach is within normal limits. Appendix appears normal. No evidence of bowel wall thickening, distention, or inflammatory changes. No free intraperitoneal gas or fluid. Vascular/Lymphatic: Mild aortoiliac atherosclerotic calcification. No aortic aneurysm. No pathologic adenopathy within the abdomen and pelvis. Reproductive: Prostate is unremarkable. Other: No abdominal wall hernia. The rectum is unremarkable. Additional images obtained through the perineum demonstrate no periscrotal inflammatory stranding or subcutaneous fluid collection. Musculoskeletal: Left hip ORIF has been performed. No acute bone abnormality. No lytic  or blastic bone lesions. IMPRESSION: No periscrotal inflammatory stranding or fluid collection identified. No acute intra-abdominal pathology. No definite radiographic explanation for the patient's reported symptoms. Moderate hepatic steatosis. Scattered areas of probable focal fatty hepatic sparing. Indeterminate 24 mm low-attenuation lesion within the left hepatic lobe. While described on prior examination of 04/20/2018, this is not well characterized on this examination and contrast enhanced MRI would be more helpful for further characterization. Aortic Atherosclerosis (ICD10-I70.0). Electronically Signed   By: Fidela Salisbury M.D.   On: 10/17/2020 02:32     Assessment/Plan Principal Problem:   Cellulitis of buttock William Benitez is admitted to [facility] floor.  Started on antibiotic therapy with vancomycin.  He initially was given Zosyn and vancomycin in the emergency room for suspected intra-abdominal infection which was not identified on CT scan so Zosyn will not be continued Recheck CBC, BMP in morning IV fluid hydration with LR  Active Problems:   Hypertension Continue home medication of coreg, cozaar. Monitor BP    Uncontrolled type 2 diabetes mellitus with hyperglycemia  Hold metformin for 48 hours after  receiving IV contrast for CT scan. Check hemoglobin A1c Monitor blood sugars with meals and bedtime.  Corrective insulin ordered for glycemic control Pharmacy to verify and reconcile patient's medications and it is confirmed he is taking Jardiance at home will resume it.    Asthma Continue Alvesco. Stioloto is substituted per formulary to brovana and ipratropium. Incentive spirometer every 2 hours while awake    Obesity, Class III, BMI 40-49.9 (morbid obesity)  Follow-up with PCP for dietary, lifestyle, pharmacotherapy interventions for weight loss and/or referral for bariatric surgery.  Patient meets criteria for bariatric surgery with his BMI and comorbid conditions of hypertension and diabetes.     DVT prophylaxis: Lovenox for DVT prophylaxis Code Status:   Full Code  Family Communication:  Gnosis and plan discussed with patient.  He verbalized understanding and agrees with plan.  Further recommendations to follow as clinically indicated Disposition Plan:   Patient is from:  Home  Anticipated DC to:  Home  Anticipated DC date:  Anticipate 2 midnight or more stay in the hospital to treat acute condition  Admission status:  Inpatient   Yevonne Aline Varvara Legault MD [facility]  How to contact the [facility] Attending or Consulting provider [place] or covering provider during after hours [place], for this patient?   Check the care team in [facility] and look for a) attending/consulting [facility] provider listed and b) the [facility] team listed Log into [email or web address] and use [place] universal password to access. If you do not have the password, please contact the hospital operator. Locate the [facility] provider you are looking for under [facility] and page to a number that you can be directly reached. If you still have difficulty reaching the provider, please page the [facility] (Director on Call) for the Hospitalists listed on amion for assistance.  10/17/2020, 3:44 AM

## 2020-10-17 NOTE — ED Notes (Addendum)
BC x1 drawn 

## 2020-10-17 NOTE — ED Notes (Signed)
Called respiratory for CPAP 

## 2020-10-18 LAB — BASIC METABOLIC PANEL
Anion gap: 9 (ref 5–15)
BUN: 12 mg/dL (ref 6–20)
CO2: 26 mmol/L (ref 22–32)
Calcium: 8.7 mg/dL — ABNORMAL LOW (ref 8.9–10.3)
Chloride: 96 mmol/L — ABNORMAL LOW (ref 98–111)
Creatinine, Ser: 0.77 mg/dL (ref 0.61–1.24)
GFR, Estimated: >60 mL/min (ref >60)
Glucose, Bld: 246 mg/dL — ABNORMAL HIGH (ref 70–99)
Potassium: 3.5 mmol/L (ref 3.5–5.1)
Sodium: 131 mmol/L — ABNORMAL LOW (ref 135–145)

## 2020-10-18 LAB — GLUCOSE, CAPILLARY
Glucose-Capillary: 259 mg/dL — ABNORMAL HIGH (ref 70–99)
Glucose-Capillary: 160 mg/dL — ABNORMAL HIGH (ref 70–99)
Glucose-Capillary: 246 mg/dL — ABNORMAL HIGH (ref 70–99)
Glucose-Capillary: 207 mg/dL — ABNORMAL HIGH (ref 70–99)
Glucose-Capillary: 252 mg/dL — ABNORMAL HIGH (ref 70–99)

## 2020-10-18 MED ORDER — INSULIN GLARGINE-YFGN 100 UNIT/ML ~~LOC~~ SOLN
20.0000 [IU] | Freq: Every day | SUBCUTANEOUS | Status: DC
  Administered 2020-10-18: 20 [IU] via SUBCUTANEOUS
  Filled 2020-10-18 (×2): qty 0.2

## 2020-10-18 MED ORDER — INSULIN GLARGINE-YFGN 100 UNIT/ML ~~LOC~~ SOLN: 25.0000 [IU] | Freq: Every day | SUBCUTANEOUS | Status: DC

## 2020-10-18 NOTE — Progress Notes (Signed)
PROGRESS NOTE    William Benitez  XNA:[id number] DOB: [birth year] DOA: 10/16/2020 PCP: Binnie Rail, MD   Chief Complaint  Patient presents with   Abscess   Brief Narrative: 50 year old male with hypertension diabetes hyperlipidemia, obesity, OSA, history of CHF presents with 1 week history of pain on the left buttock that had been draining He smokes a few mini cigars every 2 to 3 days.  Denies alcohol or illicit drug use In the ED hypertensive lactic acidosis draining area with surrounding erythema  CT scan of the abdomen pelvis was obtained which showed no intra-abdominal abscess and there is no abscess identified in the buttocks and no signs of forneirs gangrene.  CBC is unremarkable.  Initial lactic acid was 2.6.  After IV fluid hydration lactic acid decreased to 1.9.  Sodium 135 potassium 4.2 chloride 100 bicarb 26 creatinine 0.95 BUN 14 calcium 9.3 AST 19 ALT 25 alk phos a 76 glucose 389.COVID swab is pending.  Patient placed on vancomycin in the emergency room.  Hospitalist service been asked to admit for further management"  Subjective: Seen this morning.  Eating his meal.  Reports still has some drainage from the soreness in his buttock feels improvement. Overnight afebrile, blood sugar running high, blood pressure stable  Assessment & Plan:  Cellulitis of buttock w/ drainage from small wound: CT abdomen/pelvis no abscess or fluid collection. on exam has a small skin erosion/old on the left buttock and perineal area, no significant tenderness, Cont vancomycin and Zosyn.  Wound culture gram-positive cocci in stain, cultures pending.cont wound care.   Lactic acidosis suspect multifactorial due to hyperglycemia and dehydration also due to cellulitis.  Resolved with IV fluid hydration in the ED.  HTN: Blood pressure improved continue Lasix Coreg, losartan.  It appears he may not be taking any of his medication at home.  HLD cont Crestor  DM uncontrolled, ? metformin at home on list,  reports taking jardiance. A1c poorly controlled 11.9 8/26.  Start Semeglee at 20 units, Cont SSI. DM coordinator following.  He will benefit with tight control of his blood sugar in the setting of his infection and cellulitis.-History of noncompliance, ordering food from outside, continue to educate about diabetes and diet Recent Labs  Lab 10/17/20 0819 10/17/20 1128 10/17/20 1653 10/17/20 2127 10/18/20 0733  GLUCAP 212* 253* 190* 259* 160*    Asthma-stable.  Continue home inhaler-brovana-incruse ellipta  Class III obesity with BMI 47.  Will benefit with weight loss, PCP follow-up and healthy lifestyle  OSA-continue CPAP  Diet Order             Diet heart healthy/carb modified Room service appropriate? Yes; Fluid consistency: Thin  Diet effective now                  Patient's Body mass index is 47.7 kg/m.  DVT prophylaxis:Lovenox  Code Status:   Code Status: Full Code  Family Communication: plan of care discussed with patient at bedside. Status is: Inpatient  Remains inpatient appropriate because:IV treatments appropriate due to intensity of illness or inability to take PO and Inpatient level of care appropriate due to severity of illness  Dispo: The patient is from: Home              Anticipated d/c is to: Home anticipated tomorrow once cultures back              Patient currently is not medically stable to d/c.   Difficult to place patient No Unresulted Labs (From  admission, onward)     Start     Ordered   10/18/20 7078  Basic metabolic panel  Once-Timed,   TIMED        10/18/20 1029   10/17/20 0104  Culture, blood (single)  (Undifferentiated -> Now sepsis confirmed (treatment and sepsis specific nursing orders))  ONCE - STAT,   STAT        10/17/20 0105   10/16/20 2337  Culture, blood (single)  ONCE - STAT,   STAT        10/16/20 2337           Medications reviewed:  Scheduled Meds:  arformoterol  15 mcg Nebulization BID   And   umeclidinium bromide  1  puff Inhalation Daily   budesonide (PULMICORT) nebulizer solution  0.25 mg Nebulization BID   carvedilol  25 mg Oral BID WC   enoxaparin (LOVENOX) injection  70 mg Subcutaneous Q24H   furosemide  40 mg Oral BID   insulin aspart  0-20 Units Subcutaneous TID WC   insulin aspart  0-5 Units Subcutaneous QHS   insulin glargine-yfgn  20 Units Subcutaneous Daily   losartan  25 mg Oral Daily   rosuvastatin  40 mg Oral Daily   Continuous Infusions:  lactated ringers 100 mL/hr at 10/17/20 0921   piperacillin-tazobactam (ZOSYN)  IV 3.375 g (10/18/20 0908)   vancomycin 1,500 mg (10/18/20 0014)   Consultants:see note  Procedures:see note Antimicrobials: Anti-infectives (From admission, onward)    Start     Dose/Rate Route Frequency Ordered Stop   10/17/20 1200  vancomycin (VANCOREADY) IVPB 1500 mg/300 mL        1,500 mg 150 mL/hr over 120 Minutes Intravenous Every 12 hours 10/17/20 0224     10/17/20 0115  vancomycin (VANCOCIN) IVPB 1000 mg/200 mL premix  Status:  Discontinued        1,000 mg 200 mL/hr over 60 Minutes Intravenous  Once 10/17/20 0105 10/17/20 0111   10/17/20 0115  vancomycin (VANCOREADY) IVPB 2000 mg/400 mL        2,000 mg 200 mL/hr over 120 Minutes Intravenous  Once 10/17/20 0111 10/17/20 0341   10/17/20 0115  piperacillin-tazobactam (ZOSYN) IVPB 3.375 g        3.375 g 12.5 mL/hr over 240 Minutes Intravenous Every 8 hours 10/17/20 0111        Culture/Microbiology    Component Value Date/Time   SDES  10/16/2020 2349    ABSCESS Performed at [facility], [place] [address], [place], [place] [address]    SPECREQUEST  10/16/2020 2349    NONE Performed at [facility], [place] [address], [place], [place] [address]    CULT PENDING 10/16/2020 2349   REPTSTATUS PENDING 10/16/2020 2349    Other culture-see note  Objective: Vitals: Today's Vitals   10/17/20 2318 10/18/20 0557 10/18/20 0712 10/18/20 0900  BP:  111/67    Pulse:  90     Resp:  16    Temp:  98.7 F (37.1 C)    TempSrc:  Oral    SpO2:  100% 97%   Weight:      Height:      PainSc: Asleep   3     Intake/Output Summary (Last 24 hours) at 10/18/2020 1046 Last data filed at 10/18/2020 0956 Gross per 24 hour  Intake 2376.41 ml  Output 950 ml  Net 1426.41 ml   Filed Weights   10/16/20 2230  Weight: (!) 146.5 kg   Weight  change:   Intake/Output from previous day: 08/26 0701 - 08/27 0700 In: 2136.4 [P.O.:120; I.V.:1366.7; IV Piggyback:649.7] Out: 950 [Urine:950] Intake/Output this shift: Total I/O In: 240 [P.O.:240] Out: 0  Filed Weights   10/16/20 2230  Weight: (!) 146.5 kg   Examination:  General exam: AAOx3, obese, pleasant,  HEENT:Oral mucosa moist, Ear/Nose WNL grossly, dentition normal. Respiratory system: bilaterally diminished, no use of accessory muscle Cardiovascular system: S1 & S2 +, No JVD,. Gastrointestinal system: Abdomen soft, NT,ND, BS+ Nervous System:Alert, awake, moving extremities and grossly nonfocal Extremities: no edema, distal peripheral pulses palpable.  Skin: No rashes,no icterus. MSK: Normal muscle bulk,tone, power  Perineal area lower buttock examined and has a small wound  Data Reviewed: I have personally reviewed following labs and imaging studies CBC: Recent Labs  Lab 10/16/20 2349 10/17/20 0358  WBC 7.7 8.4  NEUTROABS 4.3  --   HGB 14.1 12.5*  HCT 43.6 39.2  MCV 84.3 85.2  PLT 291 175   Basic Metabolic Panel: Recent Labs  Lab 10/16/20 2349 10/17/20 0358  NA 135 138  K 4.2 4.0  CL 100 105  CO2 26 26  GLUCOSE 389* 261*  BUN 14 13  CREATININE 0.95 0.89  CALCIUM 9.3 8.7*   GFR: Estimated Creatinine Clearance: 146.6 mL/min (by C-G formula based on SCr of 0.89 mg/dL). Liver Function Tests: Recent Labs  Lab 10/16/20 2349  AST 19  ALT 25  ALKPHOS 76  BILITOT 0.4  PROT 8.3*  ALBUMIN 3.7   No results for input(s): LIPASE, AMYLASE in the last 168 hours. No results for input(s): AMMONIA  in the last 168 hours. Coagulation Profile: No results for input(s): INR, PROTIME in the last 168 hours. Cardiac Enzymes: No results for input(s): CKTOTAL, CKMB, CKMBINDEX, TROPONINI in the last 168 hours. BNP (last 3 results) No results for input(s): PROBNP in the last 8760 hours. HbA1C: Recent Labs    10/17/20 0358  HGBA1C 11.9*   CBG: Recent Labs  Lab 10/17/20 0819 10/17/20 1128 10/17/20 1653 10/17/20 2127 10/18/20 0733  GLUCAP 212* 253* 190* 259* 160*   Lipid Profile: No results for input(s): CHOL, HDL, LDLCALC, TRIG, CHOLHDL, LDLDIRECT in the last 72 hours. Thyroid Function Tests: No results for input(s): TSH, T4TOTAL, FREET4, T3FREE, THYROIDAB in the last 72 hours. Anemia Panel: No results for input(s): VITAMINB12, FOLATE, FERRITIN, TIBC, IRON, RETICCTPCT in the last 72 hours. Sepsis Labs: Recent Labs  Lab 10/16/20 2349 10/17/20 0235  LATICACIDVEN 2.6* 1.9    Recent Results (from the past 240 hour(s))  Aerobic Culture w Gram Stain (superficial specimen)     Status: None (Preliminary result)   Collection Time: 10/16/20 11:49 PM   Specimen: Abscess  Result Value Ref Range Status   Specimen Description   Final    ABSCESS Performed at [place] [address] [place], [place] [address]    Special Requests   Final    NONE Performed at [facility], [place] [address], [place], [place] [address]    Gram Stain   Final    NO SQUAMOUS EPITHELIAL CELLS SEEN RARE WBC SEEN FEW GRAM POSITIVE COCCI Performed at [facility], [place] [address] [place], [place] [address]    Culture PENDING  Incomplete   Report Status PENDING  Incomplete  Resp Panel by RT-PCR (Flu A&B, Covid) Nasopharyngeal Swab     Status: None   Collection Time: 10/17/20  2:58 AM   Specimen: Nasopharyngeal Swab; Nasopharyngeal(NP) swabs in vial transport  medium  Result Value Ref Range Status   SARS Coronavirus 2 by RT PCR NEGATIVE NEGATIVE Final     Comment: (NOTE) SARS-CoV-2 target nucleic acids are NOT DETECTED.  The SARS-CoV-2 RNA is generally detectable in upper respiratory specimens during the acute phase of infection. The lowest concentration of SARS-CoV-2 viral copies this assay can detect is 138 copies/mL. A negative result does not preclude SARS-Cov-2 infection and should not be used as the sole basis for treatment or other patient management decisions. A negative result may occur with  improper specimen collection/handling, submission of specimen other than nasopharyngeal swab, presence of viral mutation(s) within the areas targeted by this assay, and inadequate number of viral copies(<138 copies/mL). A negative result must be combined with clinical observations, patient history, and epidemiological information. The expected result is Negative.  Fact Sheet for Patients:  EntrepreneurPulse.com.au  Fact Sheet for Healthcare Providers:  IncredibleEmployment.be  This test is no t yet approved or cleared by the [place] FDA and  has been authorized for detection and/or diagnosis of SARS-CoV-2 by FDA under an Emergency Use Authorization (EUA). This EUA will remain  in effect (meaning this test can be used) for the duration of the COVID-19 declaration under Section 564(b)(1) of the Act, 21 U.S.C.section 360bbb-3(b)(1), unless the authorization is terminated  or revoked sooner.       Influenza A by PCR NEGATIVE NEGATIVE Final   Influenza B by PCR NEGATIVE NEGATIVE Final    Comment: (NOTE) The Xpert Xpress SARS-CoV-2/FLU/RSV plus assay is intended as an aid in the diagnosis of influenza from Nasopharyngeal swab specimens and should not be used as a sole basis for treatment. Nasal washings and aspirates are unacceptable for Xpert Xpress SARS-CoV-2/FLU/RSV testing.  Fact Sheet for Patients: EntrepreneurPulse.com.au  Fact Sheet for Healthcare  Providers: IncredibleEmployment.be  This test is not yet approved or cleared by the [place] FDA and has been authorized for detection and/or diagnosis of SARS-CoV-2 by FDA under an Emergency Use Authorization (EUA). This EUA will remain in effect (meaning this test can be used) for the duration of the COVID-19 declaration under Section 564(b)(1) of the Act, 21 U.S.C. section 360bbb-3(b)(1), unless the authorization is terminated or revoked.  Performed at [facility], [place] [address] [place], [place] [address]      Radiology Studies: CT ABDOMEN PELVIS W CONTRAST  Result Date: 10/17/2020 CLINICAL DATA:  Abdominal abscess/infection suspected. Periscrotal drainage. EXAM: CT ABDOMEN AND PELVIS WITH CONTRAST TECHNIQUE: Multidetector CT imaging of the abdomen and pelvis was performed using the standard protocol following bolus administration of intravenous contrast. CONTRAST:  116m OMNIPAQUE IOHEXOL 350 MG/ML SOLN COMPARISON:  05/02/2017 FINDINGS: Lower chest: No acute abnormality. Hepatobiliary: Moderate hepatic steatosis. Wedge like region of increased attenuation within the peripheral right hepatic lobe is most in keeping with focal fatty sparing and appears stable since prior examination. Similar appearing region noted along the interlobar fissure anteriorly. A 24 mm low-attenuation lesion is seen within segment 4 of the liver, image # 22/2. While not seen on prior noncontrast CT examination, this was described on CT examination of 04/20/2018 and appears similar by description. This, however, is indeterminate. No intra or extrahepatic biliary ductal dilation. Gallbladder is unremarkable. Pancreas: Unremarkable Spleen: Unremarkable Adrenals/Urinary Tract: The adrenal glands are unremarkable. The kidneys are normal in size and position. Simple cortical cyst noted within the upper pole of the right kidney. The kidneys are otherwise unremarkable. The bladder  is unremarkable. Stomach/Bowel: Stomach is within normal limits. Appendix appears  normal. No evidence of bowel wall thickening, distention, or inflammatory changes. No free intraperitoneal gas or fluid. Vascular/Lymphatic: Mild aortoiliac atherosclerotic calcification. No aortic aneurysm. No pathologic adenopathy within the abdomen and pelvis. Reproductive: Prostate is unremarkable. Other: No abdominal wall hernia. The rectum is unremarkable. Additional images obtained through the perineum demonstrate no periscrotal inflammatory stranding or subcutaneous fluid collection. Musculoskeletal: Left hip ORIF has been performed. No acute bone abnormality. No lytic or blastic bone lesions. IMPRESSION: No periscrotal inflammatory stranding or fluid collection identified. No acute intra-abdominal pathology. No definite radiographic explanation for the patient's reported symptoms. Moderate hepatic steatosis. Scattered areas of probable focal fatty hepatic sparing. Indeterminate 24 mm low-attenuation lesion within the left hepatic lobe. While described on prior examination of 04/20/2018, this is not well characterized on this examination and contrast enhanced MRI would be more helpful for further characterization. Aortic Atherosclerosis (ICD10-I70.0). Electronically Signed   By: Fidela Salisbury M.D.   On: 10/17/2020 02:32     LOS: 1 day   Antonieta Pert, MD [facility]  10/18/2020, 10:46 AM

## 2020-10-18 NOTE — Discharge Summary (Signed)
Physician Discharge Summary  Payson Evrard DSK:[id number] DOB: 1970-05-19 DOA: 10/16/2020  PCP: Binnie Rail, MD  Admit date: 10/16/2020 Discharge date: 10/20/2020  Admitted From: home Disposition:  home  Recommendations for Outpatient Follow-up:  Follow up with PCP in 1-2 weeks  Home Health:no  Equipment/Devices: none  Discharge Condition: Stable Code Status:   Code Status: Full Code Diet recommendation:  Diet Order             Diet heart healthy/carb modified Room service appropriate? Yes; Fluid consistency: Thin  Diet effective now                    Brief/Interim Summary:  50 year old male with hypertension diabetes hyperlipidemia, obesity, OSA, history of CHF presents with 1 week history of pain on the left buttock that had been draining He smokes a few mini cigars every 2 to 3 days.  Denies alcohol or illicit drug use In the ED hypertensive lactic acidosis draining area with surrounding erythema  CT scan of the abdomen pelvis was obtained which showed no intra-abdominal abscess and there is no abscess identified in the buttocks and no signs of forneirs gangrene.  CBC is unremarkable.  Initial lactic acid was 2.6.  After IV fluid hydration lactic acid decreased to 1.9.  Sodium 135 potassium 4.2 chloride 100 bicarb 26 creatinine 0.95 BUN 14 calcium 9.3 AST 19 ALT 25 alk phos a 76 glucose 389.COVID swab is pending.  Patient placed on vancomycin in the emergency room.  Hospitalist service been asked to admit for further management" wound cx [facility].  Managed with IV antibiotics. Patient also initiated on insulin regimen for uncontrolled hyperglycemia. Appears stable and improved.  He will be discharged on oral antibiotics long-acting insulin with instruction to follow-up with PCP next week.  Discharge Diagnoses:   Cellulitis of buttock w/ drainage from small wound: CT abdomen/pelvis no abscess or fluid collection. on exam has a small skin erosion/old on the left buttock/perineal  area.managed with IV antibiotics clinically improved.Wound culture from ED gram stain GPC- cs: ABUNDANT STREPTOCOCCUS AGALACTIAE.  We will discharge him on oral Keflex   Lactic acidosis suspect multifactorial due to hyperglycemia and dehydration also due to cellulitis.  Resolved with IV fluid hydration in the ED. HTN: BP controlled on Lasix Coreg, losartan.   HLD cont Crestor   DM with uncontrolled hyperglycemia, uncontrolled HbA1c 11.9. ? metformin at home on list, reports taking jardiance.  Started on long-acting insulin-blood sugar control on 30 units discharging on the same and will follow up with PCP with before every meal at bedtime monitoring of blood sugar at home  Recent Labs  Lab 10/19/20 0826 10/19/20 1206 10/19/20 1703 10/19/20 2132 10/20/20 0727  GLUCAP 190* 226* 216* 267* 142*      Asthma-stable.  Continue home inhaler-brovana-incruse ellipta   Class III obesity with BMI 47.  Will benefit with weight loss, PCP follow-up and healthy lifestyle   OSA-continue CPAP   Consults: DM Co-ordinator  Subjective: Alert awake oriented resting comfortably agreed for discharge home.  Discharge Exam: Vitals:   10/20/20 0629 10/20/20 0825  BP: (!) 158/78   Pulse: 83   Resp: 20   Temp: 98.4 F (36.9 C)   SpO2: 99% 99%   General: Pt is alert, awake, not in acute distress Cardiovascular: RRR, S1/S2 +, no rubs, no gallops Respiratory: CTA bilaterally, no wheezing, no rhonchi Abdominal: Soft, NT, ND, bowel sounds + Extremities: no edema, no cyanosis  Discharge Instructions  Discharge Instructions  Discharge instructions   Complete by: As directed    Follow-up with PCP in 1 week for insulin adjustment  Please call call MD or return to ER for similar or worsening recurring problem that brought you to hospital or if any fever,nausea/vomiting,abdominal pain, uncontrolled pain, chest pain,  shortness of breath or any other alarming symptoms.  Please follow-up your doctor  as instructed in a week time and call the office for appointment.  Please avoid alcohol, smoking, or any other illicit substance and maintain healthy habits including taking your regular medications as prescribed.  You were cared for by a hospitalist during your hospital stay. If you have any questions about your discharge medications or the care you received while you were in the hospital after you are discharged, you can call the unit and ask to speak with the hospitalist on call if the hospitalist that took care of you is not available.  Once you are discharged, your primary care physician will handle any further medical issues. Please note that NO REFILLS for any discharge medications will be authorized once you are discharged, as it is imperative that you return to your primary care physician (or establish a relationship with a primary care physician if you do not have one) for your aftercare needs so that they can reassess your need for medications and monitor your lab values    Check blood sugar QACHS  If blood sugar running above 200 less than 70 please call your MD to adjust insulin. If blood sugars running less 100 do not use insulin and call MD. If you noticed signs and symptoms of hypoglycemia or low blood sugar like jitteriness, confusion, thirst, tremor, sweating- Check blood sugar, drink sugary drink/biscuits/sweets to increase sugar level and call MD or return to ER.  Please call call MD or return to ER for similar or worsening recurring problem that brought you to hospital or if any fever,nausea/vomiting,abdominal pain, uncontrolled pain, chest pain,  shortness of breath or any other alarming symptoms.  Please follow-up your doctor as instructed in a week time and call the office for appointment.  Please avoid alcohol, smoking, or any other illicit substance and maintain healthy habits including taking your regular medications as prescribed.  You were cared for by a hospitalist  during your hospital stay. If you have any questions about your discharge medications or the care you received while you were in the hospital after you are discharged, you can call the unit and ask to speak with the hospitalist on call if the hospitalist that took care of you is not available.  Once you are discharged, your primary care physician will handle any further medical issues. Please note that NO REFILLS for any discharge medications will be authorized once you are discharged, as it is imperative that you return to your primary care physician (or establish a relationship with a primary care physician if you do not have one) for your aftercare needs so that they can reassess your need for medications and monitor your lab values   Increase activity slowly   Complete by: As directed       Allergies as of 10/20/2020   No Known Allergies      Medication List     STOP taking these medications    clotrimazole 1 % cream Commonly known as: LOTRIMIN   GOODYS BODY PAIN PO   methocarbamol 500 MG tablet Commonly known as: ROBAXIN       TAKE these medications  albuterol 108 (90 Base) MCG/ACT inhaler Commonly known as: VENTOLIN HFA Inhale 1-2 puffs into the lungs every 6 (six) hours as needed for wheezing or shortness of breath.   aspirin 325 MG tablet Take 650 mg by mouth every 6 (six) hours as needed for headache.   B COMPLEX PO Take 1 tablet by mouth daily.   blood glucose meter kit and supplies Kit Dispense based on patient and insurance preference. Use up to four times daily as directed. (FOR E11.9).   carvedilol 25 MG tablet Commonly known as: COREG Take 1 tablet (25 mg total) by mouth 2 (two) times daily with a meal.   cephALEXin 500 MG capsule Commonly known as: KEFLEX Take 1 capsule (500 mg total) by mouth 4 (four) times daily for 7 days.   ciclesonide 160 MCG/ACT inhaler Commonly known as: ALVESCO Inhale 1 puff into the lungs 2 (two) times daily.    FINASTERIDE PO Take 1 tablet by mouth 2 (two) times daily. Unable to verify dosing w/ pharmacy records   furosemide 40 MG tablet Commonly known as: LASIX Take 1 tablet (40 mg total) by mouth 2 (two) times daily.   ibuprofen 200 MG tablet Commonly known as: ADVIL Take 400 mg by mouth every 6 (six) hours as needed for mild pain.   Jardiance 25 MG Tabs tablet Generic drug: empagliflozin Take 0.5 tablets by mouth daily.   Lantus SoloStar 100 UNIT/ML Solostar Pen Generic drug: insulin glargine Inject 30 Units into the skin daily.   losartan 25 MG tablet Commonly known as: COZAAR Take 1 tablet (25 mg total) by mouth daily.   metFORMIN 500 MG tablet Commonly known as: GLUCOPHAGE Take 1 tablet (500 mg total) by mouth 2 (two) times daily with a meal.   multivitamin tablet Take 1 tablet by mouth daily.   pantoprazole 40 MG tablet Commonly known as: PROTONIX Take 1 tablet (40 mg total) by mouth daily. What changed:  when to take this reasons to take this   rosuvastatin 40 MG tablet Commonly known as: Crestor Take 1 tablet (40 mg total) by mouth daily.   Stiolto Respimat 2.5-2.5 MCG/ACT Aers Generic drug: Tiotropium Bromide-Olodaterol Inhale 2 puffs into the lungs daily.        Follow-up Information     Binnie Rail, MD Follow up in 1 week(s).   Specialty: Internal Medicine Contact information: [place] [address] [phone]         Elouise Munroe, MD .   Specialties: Cardiology, Radiology Contact information: [address] [place] [address] [phone]                No Known Allergies  The results of significant diagnostics from this hospitalization (including imaging, microbiology, ancillary and laboratory) are listed below for reference.    Microbiology: Recent Results (from the past 240 hour(s))  Aerobic Culture w Gram Stain (superficial specimen)     Status: None   Collection Time: 10/16/20  11:49 PM   Specimen: Abscess  Result Value Ref Range Status   Specimen Description   Final    ABSCESS Performed at [facility] [address] [place], [place] [address]    Special Requests   Final    NONE Performed at [facility], [place] [address], [place], [place] [address]    Gram Stain   Final    NO SQUAMOUS EPITHELIAL CELLS SEEN RARE WBC SEEN FEW GRAM POSITIVE COCCI  Culture   Final    ABUNDANT STREPTOCOCCUS AGALACTIAE TESTING AGAINST S. AGALACTIAE NOT ROUTINELY PERFORMED DUE TO PREDICTABILITY OF AMP/PEN/VAN SUSCEPTIBILITY. WITHIN MIXED SKIN FLORA Performed at [facility], [place] [address], [place], [place] [address]    Report Status 10/19/2020 FINAL  Final  Culture, blood (single)     Status: None (Preliminary result)   Collection Time: 10/16/20 11:49 PM   Specimen: BLOOD LEFT FOREARM  Result Value Ref Range Status   Specimen Description   Final    BLOOD LEFT FOREARM Performed at [facility] [address] [place], [place] [address]    Special Requests   Final    BOTTLES DRAWN AEROBIC AND ANAEROBIC Blood Culture adequate volume Performed at [place] [address], [place], [place] [address]    Culture   Final    NO GROWTH 3 DAYS Performed at [facility], [place] [address] [place], [place] [address]    Report Status PENDING  Incomplete  Culture, blood (single)     Status: None (Preliminary result)   Collection Time: 10/17/20  1:04 AM   Specimen: BLOOD LEFT FOREARM  Result Value Ref Range Status   Specimen Description   Final    BLOOD LEFT FOREARM Performed at [facility] [address] [place], [place] [address]    Special Requests   Final    BOTTLES DRAWN AEROBIC AND ANAEROBIC Blood Culture adequate volume Performed at [place] [address] [place] [address] [place], [place] [address]    Culture   Final    NO GROWTH 3  DAYS Performed at [facility], [place] [address], [place], [place] [address]    Report Status PENDING  Incomplete  Resp Panel by RT-PCR (Flu A&B, Covid) Nasopharyngeal Swab     Status: None   Collection Time: 10/17/20  2:58 AM   Specimen: Nasopharyngeal Swab; Nasopharyngeal(NP) swabs in vial transport medium  Result Value Ref Range Status   SARS Coronavirus 2 by RT PCR NEGATIVE NEGATIVE Final    Comment: (NOTE) SARS-CoV-2 target nucleic acids are NOT DETECTED.  The SARS-CoV-2 RNA is generally detectable in upper respiratory specimens during the acute phase of infection. The lowest concentration of SARS-CoV-2 viral copies this assay can detect is 138 copies/mL. A negative result does not preclude SARS-Cov-2 infection and should not be used as the sole basis for treatment or other patient management decisions. A negative result may occur with  improper specimen collection/handling, submission of specimen other than nasopharyngeal swab, presence of viral mutation(s) within the areas targeted by this assay, and inadequate number of viral copies(<138 copies/mL). A negative result must be combined with clinical observations, patient history, and epidemiological information. The expected result is Negative.  Fact Sheet for Patients:  EntrepreneurPulse.com.au  Fact Sheet for Healthcare Providers:  IncredibleEmployment.be  This test is no t yet approved or cleared by the [place] FDA and  has been authorized for detection and/or diagnosis of SARS-CoV-2 by FDA under an Emergency Use Authorization (EUA). This EUA will remain  in effect (meaning this test can be used) for the duration of the COVID-19 declaration under Section 564(b)(1) of the Act, 21 U.S.C.section 360bbb-3(b)(1), unless the authorization is terminated  or revoked sooner.       Influenza A by PCR NEGATIVE NEGATIVE Final   Influenza B by PCR NEGATIVE NEGATIVE Final    Comment:  (NOTE) The Xpert Xpress SARS-CoV-2/FLU/RSV plus assay is intended as an aid  in the diagnosis of influenza from Nasopharyngeal swab specimens and should not be used as a sole basis for treatment. Nasal washings and aspirates are unacceptable for Xpert Xpress SARS-CoV-2/FLU/RSV testing.  Fact Sheet for Patients: EntrepreneurPulse.com.au  Fact Sheet for Healthcare Providers: IncredibleEmployment.be  This test is not yet approved or cleared by the [place] FDA and has been authorized for detection and/or diagnosis of SARS-CoV-2 by FDA under an Emergency Use Authorization (EUA). This EUA will remain in effect (meaning this test can be used) for the duration of the COVID-19 declaration under Section 564(b)(1) of the Act, 21 U.S.C. section 360bbb-3(b)(1), unless the authorization is terminated or revoked.  Performed at [facility], [place] [address] [place], [place] [address]     Procedures/Studies: CT ABDOMEN PELVIS W CONTRAST  Result Date: 10/17/2020 CLINICAL DATA:  Abdominal abscess/infection suspected. Periscrotal drainage. EXAM: CT ABDOMEN AND PELVIS WITH CONTRAST TECHNIQUE: Multidetector CT imaging of the abdomen and pelvis was performed using the standard protocol following bolus administration of intravenous contrast. CONTRAST:  137m OMNIPAQUE IOHEXOL 350 MG/ML SOLN COMPARISON:  05/02/2017 FINDINGS: Lower chest: No acute abnormality. Hepatobiliary: Moderate hepatic steatosis. Wedge like region of increased attenuation within the peripheral right hepatic lobe is most in keeping with focal fatty sparing and appears stable since prior examination. Similar appearing region noted along the interlobar fissure anteriorly. A 24 mm low-attenuation lesion is seen within segment 4 of the liver, image # 22/2. While not seen on prior noncontrast CT examination, this was described on CT examination of 04/20/2018 and appears similar by  description. This, however, is indeterminate. No intra or extrahepatic biliary ductal dilation. Gallbladder is unremarkable. Pancreas: Unremarkable Spleen: Unremarkable Adrenals/Urinary Tract: The adrenal glands are unremarkable. The kidneys are normal in size and position. Simple cortical cyst noted within the upper pole of the right kidney. The kidneys are otherwise unremarkable. The bladder is unremarkable. Stomach/Bowel: Stomach is within normal limits. Appendix appears normal. No evidence of bowel wall thickening, distention, or inflammatory changes. No free intraperitoneal gas or fluid. Vascular/Lymphatic: Mild aortoiliac atherosclerotic calcification. No aortic aneurysm. No pathologic adenopathy within the abdomen and pelvis. Reproductive: Prostate is unremarkable. Other: No abdominal wall hernia. The rectum is unremarkable. Additional images obtained through the perineum demonstrate no periscrotal inflammatory stranding or subcutaneous fluid collection. Musculoskeletal: Left hip ORIF has been performed. No acute bone abnormality. No lytic or blastic bone lesions. IMPRESSION: No periscrotal inflammatory stranding or fluid collection identified. No acute intra-abdominal pathology. No definite radiographic explanation for the patient's reported symptoms. Moderate hepatic steatosis. Scattered areas of probable focal fatty hepatic sparing. Indeterminate 24 mm low-attenuation lesion within the left hepatic lobe. While described on prior examination of 04/20/2018, this is not well characterized on this examination and contrast enhanced MRI would be more helpful for further characterization. Aortic Atherosclerosis (ICD10-I70.0). Electronically Signed   By: AFidela SalisburyM.D.   On: 10/17/2020 02:32    Labs: BNP (last 3 results) No results for input(s): BNP in the last 8760 hours. Basic Metabolic Panel: Recent Labs  Lab 10/16/20 2349 10/17/20 0358 10/18/20 1149 10/20/20 0415  NA 135 138 131* 136  K 4.2  4.0 3.5 3.4*  CL 100 105 96* 102  CO2 26 26 26 27   GLUCOSE 389* 261* 246* 148*  BUN 14 13 12 14   CREATININE 0.95 0.89 0.77 0.66  CALCIUM 9.3 8.7* 8.7* 9.0   Liver Function Tests: Recent Labs  Lab 10/16/20 2349  AST 19  ALT 25  ALKPHOS 76  BILITOT  0.4  PROT 8.3*  ALBUMIN 3.7   No results for input(s): LIPASE, AMYLASE in the last 168 hours. No results for input(s): AMMONIA in the last 168 hours. CBC: Recent Labs  Lab 10/16/20 2349 10/17/20 0358 10/20/20 0415  WBC 7.7 8.4 6.2  NEUTROABS 4.3  --   --   HGB 14.1 12.5* 13.9  HCT 43.6 39.2 43.1  MCV 84.3 85.2 84.7  PLT 291 251 256   Cardiac Enzymes: No results for input(s): CKTOTAL, CKMB, CKMBINDEX, TROPONINI in the last 168 hours. BNP: Invalid input(s): POCBNP CBG: Recent Labs  Lab 10/19/20 0826 10/19/20 1206 10/19/20 1703 10/19/20 2132 10/20/20 0727  GLUCAP 190* 226* 216* 267* 142*   D-Dimer No results for input(s): DDIMER in the last 72 hours. Hgb A1c No results for input(s): HGBA1C in the last 72 hours.  Lipid Profile No results for input(s): CHOL, HDL, LDLCALC, TRIG, CHOLHDL, LDLDIRECT in the last 72 hours. Thyroid function studies No results for input(s): TSH, T4TOTAL, T3FREE, THYROIDAB in the last 72 hours.  Invalid input(s): FREET3 Anemia work up No results for input(s): VITAMINB12, FOLATE, FERRITIN, TIBC, IRON, RETICCTPCT in the last 72 hours. Urinalysis    Component Value Date/Time   COLORURINE YELLOW 05/01/2017 1919   APPEARANCEUR CLEAR 05/01/2017 1919   LABSPEC 1.023 05/01/2017 1919   PHURINE 5.0 05/01/2017 1919   GLUCOSEU NEGATIVE 05/01/2017 1919   HGBUR NEGATIVE 05/01/2017 [place] NEGATIVE 05/01/2017 [place] NEGATIVE 05/01/2017 1919   PROTEINUR NEGATIVE 05/01/2017 1919   NITRITE NEGATIVE 05/01/2017 1919   LEUKOCYTESUR NEGATIVE 05/01/2017 1919   Sepsis Labs Invalid input(s): PROCALCITONIN,  WBC,  LACTICIDVEN Microbiology Recent Results (from the past 240 hour(s))   Aerobic Culture w Gram Stain (superficial specimen)     Status: None   Collection Time: 10/16/20 11:49 PM   Specimen: Abscess  Result Value Ref Range Status   Specimen Description   Final    ABSCESS Performed at [facility], [place] [address] [place], [place] [address]    Special Requests   Final    NONE Performed at [facility], [place] [address] [place], [place] [address]    Gram Stain   Final    NO SQUAMOUS EPITHELIAL CELLS SEEN RARE WBC SEEN FEW GRAM POSITIVE COCCI    Culture   Final    ABUNDANT STREPTOCOCCUS AGALACTIAE TESTING AGAINST S. AGALACTIAE NOT ROUTINELY PERFORMED DUE TO PREDICTABILITY OF AMP/PEN/VAN SUSCEPTIBILITY. WITHIN MIXED SKIN FLORA Performed at [facility], [place] [address], [place], [place] [address]    Report Status 10/19/2020 FINAL  Final  Culture, blood (single)     Status: None (Preliminary result)   Collection Time: 10/16/20 11:49 PM   Specimen: BLOOD LEFT FOREARM  Result Value Ref Range Status   Specimen Description   Final    BLOOD LEFT FOREARM Performed at [facility] [address] [place], [place] [address]    Special Requests   Final    BOTTLES DRAWN AEROBIC AND ANAEROBIC Blood Culture adequate volume Performed at [facility] [address] [place], [place] [address]    Culture   Final    NO GROWTH 3 DAYS Performed at [facility], [place] [address], [place], [place] [address]    Report Status PENDING  Incomplete  Culture, blood (single)     Status: None (Preliminary result)   Collection Time: 10/17/20  1:04 AM   Specimen: BLOOD LEFT FOREARM  Result  Value Ref Range Status   Specimen Description   Final    BLOOD LEFT FOREARM Performed at [place] [address] [place], [place] [address]    Special Requests   Final    BOTTLES DRAWN AEROBIC AND ANAEROBIC Blood Culture adequate volume Performed at [facility] [address], [place], [place] [address]    Culture   Final    NO GROWTH 3 DAYS Performed at [facility], [place] [address] [place], [place] [address]    Report Status PENDING  Incomplete  Resp Panel by RT-PCR (Flu A&B, Covid) Nasopharyngeal Swab     Status: None   Collection Time: 10/17/20  2:58 AM   Specimen: Nasopharyngeal Swab; Nasopharyngeal(NP) swabs in vial transport medium  Result Value Ref Range Status   SARS Coronavirus 2 by RT PCR NEGATIVE NEGATIVE Final    Comment: (NOTE) SARS-CoV-2 target nucleic acids are NOT DETECTED.  The SARS-CoV-2 RNA is generally detectable in upper respiratory specimens during the acute phase of infection. The lowest concentration of SARS-CoV-2 viral copies this assay can detect is 138 copies/mL. A negative result does not preclude SARS-Cov-2 infection and should not be used as the sole basis for treatment or other patient management decisions. A negative result may occur with  improper specimen collection/handling, submission of specimen other than nasopharyngeal swab, presence of viral mutation(s) within the areas targeted by this assay, and inadequate number of viral copies(<138 copies/mL). A negative result must be combined with clinical observations, patient history, and epidemiological information. The expected result is Negative.  Fact Sheet for Patients:  EntrepreneurPulse.com.au  Fact Sheet for Healthcare Providers:  IncredibleEmployment.be  This test is no t yet approved or cleared by the [place] FDA and  has been authorized for detection and/or diagnosis of SARS-CoV-2 by FDA under an Emergency Use Authorization (EUA). This EUA will remain  in effect (meaning this test can be used) for the duration of the COVID-19 declaration under Section 564(b)(1) of the Act, 21 U.S.C.section 360bbb-3(b)(1), unless the authorization is terminated  or revoked sooner.       Influenza A  by PCR NEGATIVE NEGATIVE Final   Influenza B by PCR NEGATIVE NEGATIVE Final    Comment: (NOTE) The Xpert Xpress SARS-CoV-2/FLU/RSV plus assay is intended as an aid in the diagnosis of influenza from Nasopharyngeal swab specimens and should not be used as a sole basis for treatment. Nasal washings and aspirates are unacceptable for Xpert Xpress SARS-CoV-2/FLU/RSV testing.  Fact Sheet for Patients: EntrepreneurPulse.com.au  Fact Sheet for Healthcare Providers: IncredibleEmployment.be  This test is not yet approved or cleared by the [place] FDA and has been authorized for detection and/or diagnosis of SARS-CoV-2 by FDA under an Emergency Use Authorization (EUA). This EUA will remain in effect (meaning this test can be used) for the duration of the COVID-19 declaration under Section 564(b)(1) of the Act, 21 U.S.C. section 360bbb-3(b)(1), unless the authorization is terminated or revoked.  Performed at [facility], [place] [address] [place],  [address]      Time coordinating discharge: 25 minutes  SIGNED: Antonieta Pert, MD  [facility] 10/20/2020, 10:01 AM  If 7PM-7AM, please contact night-coverage [email or web address]

## 2020-10-19 LAB — AEROBIC CULTURE W GRAM STAIN (SUPERFICIAL SPECIMEN): Gram Stain: NONE SEEN

## 2020-10-19 LAB — GLUCOSE, CAPILLARY
Glucose-Capillary: 190 mg/dL — ABNORMAL HIGH (ref 70–99)
Glucose-Capillary: 226 mg/dL — ABNORMAL HIGH (ref 70–99)
Glucose-Capillary: 216 mg/dL — ABNORMAL HIGH (ref 70–99)
Glucose-Capillary: 267 mg/dL — ABNORMAL HIGH (ref 70–99)

## 2020-10-19 MED ORDER — INSULIN GLARGINE-YFGN 100 UNIT/ML ~~LOC~~ SOLN
30.0000 [IU] | Freq: Every day | SUBCUTANEOUS | Status: DC
  Administered 2020-10-19 – 2020-10-20 (×2): 30 [IU] via SUBCUTANEOUS
  Filled 2020-10-19 (×2): qty 0.3

## 2020-10-19 NOTE — Progress Notes (Signed)
PROGRESS NOTE    William Benitez  IRS:[id number] DOB: 01-Oct-1970 DOA: 10/16/2020 PCP: Binnie Rail, MD   Chief Complaint  Patient presents with   Abscess   Brief Narrative: 50 year old male with hypertension diabetes hyperlipidemia, obesity, OSA, history of CHF presents with 1 week history of pain on the left buttock that had been draining He smokes a few mini cigars every 2 to 3 days.  Denies alcohol or illicit drug use In the ED hypertensive lactic acidosis draining area with surrounding erythema  CT scan of the abdomen pelvis was obtained which showed no intra-abdominal abscess and there is no abscess identified in the buttocks and no signs of forneirs gangrene.  CBC is unremarkable.  Initial lactic acid was 2.6.  After IV fluid hydration lactic acid decreased to 1.9.  Sodium 135 potassium 4.2 chloride 100 bicarb 26 creatinine 0.95 BUN 14 calcium 9.3 AST 19 ALT 25 alk phos a 76 glucose 389.COVID swab is pending.  Patient placed on vancomycin in the emergency room.  Hospitalist service been asked to admit for further management"  Subjective: Reports feeling better less pain on buttock Does not feel ready for home today. Assessment & Plan:  Cellulitis of buttock w/ drainage from small wound: CT abdomen/pelvis no abscess or fluid collection. on exam has a small skin erosion/old on the left buttock and perineal area,-clinically improving, wound culture with GPC, continue vancomycin stop Zosyn.  Final culture is pending  Lactic acidosis suspect multifactorial due to hyperglycemia and dehydration also due to cellulitis.  Resolved with IV fluid hydration in the ED.  HTN: Controlled on home lasix Coreg, losartan.  It appears he may not be taking any of his medication at home.  HLD cont Crestor  DM uncontrolled, ? metformin at home on list, reports taking jardiance. A1c poorly controlled 11.9 8/26.  Started on Semeglee increased to 30 units. Cont SSI. DM coordinator following.  He will benefit  with tight control of his blood sugar in the setting of his infection and cellulitis.-Discussed in detail for the need to be compliant.   Recent Labs  Lab 10/18/20 0733 10/18/20 1136 10/18/20 1649 10/18/20 2114 10/19/20 0826  GLUCAP 160* 246* 207* 252* 190*     Asthma-not wheezing.  Continue home inhaler-brovana-incruse ellipta  Class III obesity with BMI 47.  Will benefit with weight loss, PCP follow-up and healthy lifestyle  OSA-continue CPAP  Diet Order             Diet heart healthy/carb modified Room service appropriate? Yes; Fluid consistency: Thin  Diet effective now                  Patient's Body mass index is 47.7 kg/m.  DVT prophylaxis:Lovenox  Code Status:   Code Status: Full Code  Family Communication: plan of care discussed with patient at bedside. Status is: Inpatient  Remains inpatient appropriate because:IV treatments appropriate due to intensity of illness or inability to take PO and Inpatient level of care appropriate due to severity of illness  Dispo: The patient is from: Home              Anticipated d/c is to: Home anticipated tomorrow once cultures back              Patient currently is not medically stable to d/c.   Difficult to place patient No Unresulted Labs (From admission, onward)     Start     Ordered   10/20/20 0093  Basic metabolic panel  Tomorrow  morning,   R       Question:  Specimen collection method  Answer:  Lab=Lab collect   10/19/20 1033   10/20/20 0500  CBC  Tomorrow morning,   R       Question:  Specimen collection method  Answer:  Lab=Lab collect   10/19/20 1033           Medications reviewed:  Scheduled Meds:  arformoterol  15 mcg Nebulization BID   And   umeclidinium bromide  1 puff Inhalation Daily   budesonide (PULMICORT) nebulizer solution  0.25 mg Nebulization BID   carvedilol  25 mg Oral BID WC   enoxaparin (LOVENOX) injection  70 mg Subcutaneous Q24H   furosemide  40 mg Oral BID   insulin aspart  0-20  Units Subcutaneous TID WC   insulin aspart  0-5 Units Subcutaneous QHS   insulin glargine-yfgn  30 Units Subcutaneous Daily   losartan  25 mg Oral Daily   rosuvastatin  40 mg Oral Daily   Continuous Infusions:  lactated ringers 100 mL/hr at 10/17/20 0921   piperacillin-tazobactam (ZOSYN)  IV 3.375 g (10/19/20 0916)   vancomycin 1,500 mg (10/18/20 2342)   Consultants:see note  Procedures:see note Antimicrobials: Anti-infectives (From admission, onward)    Start     Dose/Rate Route Frequency Ordered Stop   10/17/20 1200  vancomycin (VANCOREADY) IVPB 1500 mg/300 mL        1,500 mg 150 mL/hr over 120 Minutes Intravenous Every 12 hours 10/17/20 0224     10/17/20 0115  vancomycin (VANCOCIN) IVPB 1000 mg/200 mL premix  Status:  Discontinued        1,000 mg 200 mL/hr over 60 Minutes Intravenous  Once 10/17/20 0105 10/17/20 0111   10/17/20 0115  vancomycin (VANCOREADY) IVPB 2000 mg/400 mL        2,000 mg 200 mL/hr over 120 Minutes Intravenous  Once 10/17/20 0111 10/17/20 0341   10/17/20 0115  piperacillin-tazobactam (ZOSYN) IVPB 3.375 g        3.375 g 12.5 mL/hr over 240 Minutes Intravenous Every 8 hours 10/17/20 0111        Culture/Microbiology    Component Value Date/Time   SDES  10/17/2020 0104    BLOOD LEFT FOREARM Performed at [facility], [place] [address], [place], [place] [address]    SPECREQUEST  10/17/2020 0104    BOTTLES DRAWN AEROBIC AND ANAEROBIC Blood Culture adequate volume Performed at [facility] [address] [place], [place] [address]    CULT  10/17/2020 0104    NO GROWTH 1 DAY Performed at [facility] [address], [place],  [address]    REPTSTATUS PENDING 10/17/2020 0104    Other culture-see note  Objective: Vitals: Today's Vitals   10/19/20 0556 10/19/20 0601 10/19/20 0729 10/19/20 0749  BP: (!) 147/88     Pulse: 84     Resp: 18     Temp: 98.3 F (36.8 C)     TempSrc: Oral     SpO2: 100%    97%  Weight:      Height:      PainSc:  3  Asleep     Intake/Output Summary (Last 24 hours) at 10/19/2020 1052 Last data filed at 10/19/2020 1015 Gross per 24 hour  Intake 2428.77 ml  Output 2400 ml  Net 28.77 ml    Filed Weights   10/16/20 2230  Weight: (!) 146.5 kg   Weight change:   Intake/Output from  previous day: 08/27 0701 - 08/28 0700 In: 3405.4 [P.O.:2160; I.V.:800; IV Piggyback:445.4] Out: 2000 [Urine:2000] Intake/Output this shift: Total I/O In: 240 [P.O.:240] Out: 400 [Urine:400] Filed Weights   10/16/20 2230  Weight: (!) 146.5 kg   Examination:  General exam: AAOx 3, obese, HEENT:Oral mucosa moist, Ear/Nose WNL grossly, dentition normal. Respiratory system: bilaterally diminished,  no use of accessory muscle Cardiovascular system: S1 & S2 +, No JVD,. Gastrointestinal system: Abdomen soft,NT,ND, BS+ Nervous System:Alert, awake, moving extremities and grossly nonfocal Extremities: no edema, distal peripheral pulses palpable.  Skin: No rashes,no icterus. MSK: Normal muscle bulk,tone, power   Data Reviewed: I have personally reviewed following labs and imaging studies CBC: Recent Labs  Lab 10/16/20 2349 10/17/20 0358  WBC 7.7 8.4  NEUTROABS 4.3  --   HGB 14.1 12.5*  HCT 43.6 39.2  MCV 84.3 85.2  PLT 291 630    Basic Metabolic Panel: Recent Labs  Lab 10/16/20 2349 10/17/20 0358 10/18/20 1149  NA 135 138 131*  K 4.2 4.0 3.5  CL 100 105 96*  CO2 _0 GLUCOSE 389* 261* 246*  BUN _1 CREATININE 0.95 0.89 0.77  CALCIUM 9.3 8.7* 8.7*    GFR: Estimated Creatinine Clearance: 163.1 mL/min (by C-G formula based on SCr of 0.77 mg/dL). Liver Function Tests: Recent Labs  Lab 10/16/20 2349  AST 19  ALT 25  ALKPHOS 76  BILITOT 0.4  PROT 8.3*  ALBUMIN 3.7    No results for input(s): LIPASE, AMYLASE in the last 168 hours. No results for input(s): AMMONIA in the last 168 hours. Coagulation Profile: No results for input(s): INR,  PROTIME in the last 168 hours. Cardiac Enzymes: No results for input(s): CKTOTAL, CKMB, CKMBINDEX, TROPONINI in the last 168 hours. BNP (last 3 results) No results for input(s): PROBNP in the last 8760 hours. HbA1C: Recent Labs    10/17/20 0358  HGBA1C 11.9*    CBG: Recent Labs  Lab 10/18/20 0733 10/18/20 1136 10/18/20 1649 10/18/20 2114 10/19/20 0826  GLUCAP 160* 246* 207* 252* 190*    Lipid Profile: No results for input(s): CHOL, HDL, LDLCALC, TRIG, CHOLHDL, LDLDIRECT in the last 72 hours. Thyroid Function Tests: No results for input(s): TSH, T4TOTAL, FREET4, T3FREE, THYROIDAB in the last 72 hours. Anemia Panel: No results for input(s): VITAMINB12, FOLATE, FERRITIN, TIBC, IRON, RETICCTPCT in the last 72 hours. Sepsis Labs: Recent Labs  Lab 10/16/20 2349 10/17/20 0235  LATICACIDVEN 2.6* 1.9     Recent Results (from the past 240 hour(s))  Aerobic Culture w Gram Stain (superficial specimen)     Status: None (Preliminary result)   Collection Time: 10/16/20 11:49 PM   Specimen: Abscess  Result Value Ref Range Status   Specimen Description   Final    ABSCESS Performed at [facility] [address], [place], [place] [address]    Special Requests   Final    NONE Performed at [facility], [place] [address], [place], [place] [address]    Gram Stain   Final    NO SQUAMOUS EPITHELIAL CELLS SEEN RARE WBC SEEN FEW GRAM POSITIVE COCCI    Culture   Final    CULTURE REINCUBATED FOR BETTER GROWTH Performed at [facility], [place] [address], [place], [place] [address]    Report Status PENDING  Incomplete  Culture, blood (single)     Status: None (Preliminary result)   Collection Time: 10/16/20 11:49 PM   Specimen: BLOOD LEFT FOREARM  Result Value Ref Range Status   Specimen Description   Final    BLOOD LEFT FOREARM Performed at [facility] [address], [place], [place] [address]    Special Requests    Final    BOTTLES DRAWN AEROBIC AND ANAEROBIC Blood Culture adequate volume Performed at [facility] [address] [place], [place] [address]    Culture   Final    NO GROWTH 1 DAY Performed at [facility], [place] [address] [place], [place] [address]    Report Status PENDING  Incomplete  Culture, blood (single)     Status: None (Preliminary result)   Collection Time: 10/17/20  1:04 AM   Specimen: BLOOD LEFT FOREARM  Result Value Ref Range Status   Specimen Description   Final    BLOOD LEFT FOREARM Performed at [facility] [address], [place], [place] [address]    Special Requests   Final    BOTTLES DRAWN AEROBIC AND ANAEROBIC Blood Culture adequate volume Performed at [facility] [address], [place], [place] [address]    Culture   Final    NO GROWTH 1 DAY Performed at [facility], [place] [address], [place], [place] [address]    Report Status PENDING  Incomplete  Resp Panel by RT-PCR (Flu A&B, Covid) Nasopharyngeal Swab     Status: None   Collection Time: 10/17/20  2:58 AM   Specimen: Nasopharyngeal Swab; Nasopharyngeal(NP) swabs in vial transport medium  Result Value Ref Range Status   SARS Coronavirus 2 by RT PCR NEGATIVE NEGATIVE Final    Comment: (NOTE) SARS-CoV-2 target nucleic acids are NOT DETECTED.  The SARS-CoV-2 RNA is generally detectable in upper respiratory specimens during the acute phase of infection. The lowest concentration of SARS-CoV-2 viral copies this assay can detect is 138 copies/mL. A negative result does not preclude SARS-Cov-2 infection and should not be used as the sole basis for treatment or other patient management decisions. A negative result may occur with  improper specimen collection/handling, submission of specimen other than nasopharyngeal swab, presence of viral mutation(s) within the areas targeted by this assay, and inadequate number of viral copies(<138  copies/mL). A negative result must be combined with clinical observations, patient history, and epidemiological information. The expected result is Negative.  Fact Sheet for Patients:  EntrepreneurPulse.com.au  Fact Sheet for Healthcare Providers:  IncredibleEmployment.be  This test is no t yet approved or cleared by the [place] FDA and  has been authorized for detection and/or diagnosis of SARS-CoV-2 by FDA under an Emergency Use Authorization (EUA). This EUA will remain  in effect (meaning this test can be used) for the duration of the COVID-19 declaration under Section 564(b)(1) of the Act, 21 U.S.C.section 360bbb-3(b)(1), unless the authorization is terminated  or revoked sooner.       Influenza A by PCR NEGATIVE NEGATIVE Final   Influenza B by PCR NEGATIVE NEGATIVE Final    Comment: (NOTE) The Xpert Xpress SARS-CoV-2/FLU/RSV plus assay is intended as an aid in the diagnosis of influenza from Nasopharyngeal swab specimens and should not be used as a sole basis for treatment. Nasal washings and aspirates are unacceptable for Xpert Xpress SARS-CoV-2/FLU/RSV testing.  Fact Sheet for Patients: EntrepreneurPulse.com.au  Fact Sheet for Healthcare Providers: IncredibleEmployment.be  This test is not yet approved or cleared by the [place] FDA and has been authorized for detection and/or diagnosis of SARS-CoV-2 by FDA under an Emergency Use Authorization (  EUA). This EUA will remain in effect (meaning this test can be used) for the duration of the COVID-19 declaration under Section 564(b)(1) of the Act, 21 U.S.C. section 360bbb-3(b)(1), unless the authorization is terminated or revoked.  Performed at [facility], [place] [address], [place], [place] [address]       Radiology Studies: No results found.   LOS: 2 days   Antonieta Pert, MD [facility]  10/19/2020, 10:52 AM

## 2020-10-19 NOTE — Progress Notes (Signed)
Noted multiple take-out food boxes this am on pt's bedside table. Visitor from last night also brought pt carry-out boxes of food (in addition to eating 100% of his supper tray). Spoke briefly w pt about the importance of diet, portion control and exercise as it relates to his T2DM. Pt verbalizes understanding

## 2020-10-20 LAB — BASIC METABOLIC PANEL
Anion gap: 7 (ref 5–15)
BUN: 14 mg/dL (ref 6–20)
CO2: 27 mmol/L (ref 22–32)
Calcium: 9 mg/dL (ref 8.9–10.3)
Chloride: 102 mmol/L (ref 98–111)
Creatinine, Ser: 0.66 mg/dL (ref 0.61–1.24)
GFR, Estimated: >60 mL/min (ref >60)
Glucose, Bld: 148 mg/dL — ABNORMAL HIGH (ref 70–99)
Potassium: 3.4 mmol/L — ABNORMAL LOW (ref 3.5–5.1)
Sodium: 136 mmol/L (ref 135–145)

## 2020-10-20 LAB — CBC
HCT: 43.1 % (ref 39.0–52.0)
Hemoglobin: 13.9 g/dL (ref 13.0–17.0)
MCH: 27.3 pg (ref 26.0–34.0)
MCHC: 32.3 g/dL (ref 30.0–36.0)
MCV: 84.7 fL (ref 80.0–100.0)
Platelets: 256 10*3/uL (ref 150–400)
RBC: 5.09 MIL/uL (ref 4.22–5.81)
RDW: 13.6 % (ref 11.5–15.5)
WBC: 6.2 10*3/uL (ref 4.0–10.5)
nRBC: 0 % (ref 0.0–0.2)

## 2020-10-20 LAB — GLUCOSE, CAPILLARY
Glucose-Capillary: 142 mg/dL — ABNORMAL HIGH (ref 70–99)
Glucose-Capillary: 240 mg/dL — ABNORMAL HIGH (ref 70–99)
Glucose-Capillary: 177 mg/dL — ABNORMAL HIGH (ref 70–99)

## 2020-10-20 MED ORDER — CEPHALEXIN 500 MG PO CAPS: 500.0000 mg | ORAL_CAPSULE | Freq: Four times a day (QID) | ORAL | 0 refills | Status: AC

## 2020-10-20 MED ORDER — LANTUS SOLOSTAR 100 UNIT/ML ~~LOC~~ SOPN: 30.0000 [IU] | PEN_INJECTOR | Freq: Every day | SUBCUTANEOUS | 1 refills | Status: AC

## 2020-10-20 NOTE — Progress Notes (Signed)
Inpatient Diabetes Program Recommendations  AACE/ADA: New Consensus Statement on Inpatient Glycemic Control (2015)  Target Ranges:  Prepandial:   less than 140 mg/dL      Peak postprandial:   less than 180 mg/dL (1-2 hours)      Critically ill patients:  140 - 180 mg/dL   Lab Results  Component Value Date   GLUCAP 142 (H) 10/20/2020   HGBA1C 11.9 (H) 10/17/2020    Review of Glycemic Control  Diabetes history: DM2 Outpatient Diabetes medications: Jardiance 12.5 mg QD Current orders for Inpatient glycemic control: Semglee 30 QD, Novolog 0-20 units TID with meals and 0-5 HS  HgbA1C - 11.9%  Inpatient Diabetes Program Recommendations:    Pt had questions concerning his diabetes control. Stressed importance of checking blood sugars at least 4x/day and record in logbook. Pt has meter and supplies at home. Discussed what healthy diet looked like, pt has been drinking large quantities of soda and is willing to switch to diet soda. Discussed eating variety of foods with emphasis on lower CHO and moderate portions. Exercise is extremely important in controlling his blood sugars. Has appt to f/u with PCP and encouraged pt to take meter and logbook to appt for review. Pt states he is going to stay on top of this and wants to lower HgbA1C to 7-8%. Reviewed insulin pen instructions. Pt seems motivated to make lifestyle changes and control his blood sugars. Discussed above with RN.  Thank you. Ailene Ards, RD, LDN, CDE Inpatient Diabetes Coordinator [phone]

## 2020-10-20 NOTE — Plan of Care (Signed)
  Problem: Education: Goal: Knowledge of General Education information will improve Description: Including pain rating scale, medication(s)/side effects and non-pharmacologic comfort measures Outcome: Progressing   Problem: Health Behavior/Discharge Planning: Goal: Ability to manage health-related needs will improve Outcome: Progressing   Problem: Clinical Measurements: Goal: Will remain free from infection Outcome: Progressing Goal: Diagnostic test results will improve Outcome: Progressing   Problem: Nutrition: Goal: Adequate nutrition will be maintained Outcome: Progressing   Problem: Pain Managment: Goal: General experience of comfort will improve Outcome: Progressing   

## 2020-10-21 ENCOUNTER — Telehealth: Payer: Self-pay

## 2020-10-21 NOTE — Telephone Encounter (Signed)
Transition Care Management Unsuccessful Follow-up Telephone Call  Date of discharge and from where:  10/20/2020 from [facility]  Attempts:  1st Attempt  Reason for unsuccessful TCM follow-up call:  Left voice message

## 2020-10-22 ENCOUNTER — Telehealth: Payer: Self-pay

## 2020-10-22 ENCOUNTER — Telehealth: Payer: Self-pay | Admitting: Internal Medicine

## 2020-10-22 ENCOUNTER — Other Ambulatory Visit (HOSPITAL_COMMUNITY): Payer: Self-pay

## 2020-10-22 LAB — CULTURE, BLOOD (SINGLE)
Culture: NO GROWTH
Culture: NO GROWTH
Special Requests: ADEQUATE
Special Requests: ADEQUATE

## 2020-10-22 NOTE — Telephone Encounter (Signed)
Transition Care Management Unsuccessful Follow-up Telephone Call  Date of discharge and from where:  10/20/2020 from [facility]  Attempts:  2nd Attempt  Reason for unsuccessful TCM follow-up call:  Left voice message

## 2020-10-22 NOTE — Telephone Encounter (Signed)
   Patient requesting [facility] appointment with Dr Jonny Ruiz Patient seeking new PCP to manage diabetes

## 2020-10-22 NOTE — Telephone Encounter (Signed)
Ok with me 

## 2020-10-22 NOTE — Telephone Encounter (Signed)
Sorry no, I would have to decline  A referral to Endocrinology and DM education would be more appropriate

## 2020-10-28 ENCOUNTER — Encounter (HOSPITAL_COMMUNITY): Payer: Self-pay

## 2020-10-28 ENCOUNTER — Other Ambulatory Visit: Payer: Self-pay

## 2020-10-28 ENCOUNTER — Emergency Department (HOSPITAL_COMMUNITY)
Admission: EM | Admit: 2020-10-28 | Discharge: 2020-10-29 | Disposition: A | Discharge status: Home / Self Care | Payer: 59 | Attending: Emergency Medicine | Admitting: Emergency Medicine

## 2020-10-28 DIAGNOSIS — Z20822 Contact with and (suspected) exposure to covid-19: Secondary | ICD-10-CM | POA: Diagnosis not present

## 2020-10-28 DIAGNOSIS — Z7951 Long term (current) use of inhaled steroids: Secondary | ICD-10-CM | POA: Insufficient documentation

## 2020-10-28 DIAGNOSIS — R0789 Other chest pain: Secondary | ICD-10-CM | POA: Diagnosis not present

## 2020-10-28 DIAGNOSIS — Z794 Long term (current) use of insulin: Secondary | ICD-10-CM | POA: Insufficient documentation

## 2020-10-28 DIAGNOSIS — J45909 Unspecified asthma, uncomplicated: Secondary | ICD-10-CM | POA: Insufficient documentation

## 2020-10-28 DIAGNOSIS — R110 Nausea: Secondary | ICD-10-CM | POA: Diagnosis not present

## 2020-10-28 DIAGNOSIS — I5032 Chronic diastolic (congestive) heart failure: Secondary | ICD-10-CM | POA: Insufficient documentation

## 2020-10-28 DIAGNOSIS — R109 Unspecified abdominal pain: Secondary | ICD-10-CM | POA: Insufficient documentation

## 2020-10-28 DIAGNOSIS — Z7984 Long term (current) use of oral hypoglycemic drugs: Secondary | ICD-10-CM | POA: Insufficient documentation

## 2020-10-28 DIAGNOSIS — Z79899 Other long term (current) drug therapy: Secondary | ICD-10-CM | POA: Diagnosis not present

## 2020-10-28 DIAGNOSIS — R0602 Shortness of breath: Secondary | ICD-10-CM | POA: Insufficient documentation

## 2020-10-28 DIAGNOSIS — R509 Fever, unspecified: Secondary | ICD-10-CM | POA: Diagnosis not present

## 2020-10-28 DIAGNOSIS — Z87891 Personal history of nicotine dependence: Secondary | ICD-10-CM | POA: Insufficient documentation

## 2020-10-28 DIAGNOSIS — R0981 Nasal congestion: Secondary | ICD-10-CM | POA: Diagnosis not present

## 2020-10-28 DIAGNOSIS — R079 Chest pain, unspecified: Secondary | ICD-10-CM

## 2020-10-28 DIAGNOSIS — I110 Hypertensive heart disease with heart failure: Secondary | ICD-10-CM | POA: Insufficient documentation

## 2020-10-28 DIAGNOSIS — E119 Type 2 diabetes mellitus without complications: Secondary | ICD-10-CM | POA: Diagnosis not present

## 2020-10-28 LAB — RESP PANEL BY RT-PCR (FLU A&B, COVID) ARPGX2
Influenza A by PCR: NEGATIVE
Influenza B by PCR: NEGATIVE
SARS Coronavirus 2 by RT PCR: NEGATIVE

## 2020-10-28 NOTE — ED Triage Notes (Signed)
Pt complains of abdominal pain, fever, SHOB, and congestion since today.

## 2020-10-29 ENCOUNTER — Emergency Department (HOSPITAL_COMMUNITY): Payer: 59

## 2020-10-29 DIAGNOSIS — R0602 Shortness of breath: Secondary | ICD-10-CM | POA: Diagnosis not present

## 2020-10-29 LAB — CBC WITH DIFFERENTIAL/PLATELET
Abs Immature Granulocytes: 0.03 10*3/uL (ref 0.00–0.07)
Basophils Absolute: 0.1 10*3/uL (ref 0.0–0.1)
Basophils Relative: 1 %
Eosinophils Absolute: 0.1 10*3/uL (ref 0.0–0.5)
Eosinophils Relative: 2 %
HCT: 43.2 % (ref 39.0–52.0)
Hemoglobin: 13.8 g/dL (ref 13.0–17.0)
Immature Granulocytes: 1 %
Lymphocytes Relative: 44 %
Lymphs Abs: 2.6 10*3/uL (ref 0.7–4.0)
MCH: 27.3 pg (ref 26.0–34.0)
MCHC: 31.9 g/dL (ref 30.0–36.0)
MCV: 85.4 fL (ref 80.0–100.0)
Monocytes Absolute: 0.5 10*3/uL (ref 0.1–1.0)
Monocytes Relative: 9 %
Neutro Abs: 2.6 10*3/uL (ref 1.7–7.7)
Neutrophils Relative %: 43 %
Platelets: 257 10*3/uL (ref 150–400)
RBC: 5.06 MIL/uL (ref 4.22–5.81)
RDW: 14.2 % (ref 11.5–15.5)
WBC: 5.9 10*3/uL (ref 4.0–10.5)
nRBC: 0 % (ref 0.0–0.2)

## 2020-10-29 LAB — COMPREHENSIVE METABOLIC PANEL
ALT: 27 U/L (ref 0–44)
AST: 20 U/L (ref 15–41)
Albumin: 3.9 g/dL (ref 3.5–5.0)
Alkaline Phosphatase: 37 U/L — ABNORMAL LOW (ref 38–126)
Anion gap: 7 (ref 5–15)
BUN: 15 mg/dL (ref 6–20)
CO2: 27 mmol/L (ref 22–32)
Calcium: 9.5 mg/dL (ref 8.9–10.3)
Chloride: 105 mmol/L (ref 98–111)
Creatinine, Ser: 0.74 mg/dL (ref 0.61–1.24)
GFR, Estimated: >60 mL/min (ref >60)
Glucose, Bld: 179 mg/dL — ABNORMAL HIGH (ref 70–99)
Potassium: 4.6 mmol/L (ref 3.5–5.1)
Sodium: 139 mmol/L (ref 135–145)
Total Bilirubin: 0.6 mg/dL (ref 0.3–1.2)
Total Protein: 8.2 g/dL — ABNORMAL HIGH (ref 6.5–8.1)

## 2020-10-29 LAB — LIPASE, BLOOD: Lipase: 50 U/L (ref 11–51)

## 2020-10-29 LAB — TROPONIN I (HIGH SENSITIVITY): Troponin I (High Sensitivity): 7 ng/L (ref <18)

## 2020-10-29 LAB — BRAIN NATRIURETIC PEPTIDE: B Natriuretic Peptide: 11.3 pg/mL (ref 0.0–100.0)

## 2020-10-29 NOTE — ED Provider Notes (Signed)
[facility] DEPT Provider Note   CSN: [id number] Arrival date & time: 10/28/20  1957     History Chief Complaint  Patient presents with   Abdominal Pain   Fever   Nasal Congestion   Shortness of Breath    William Benitez is a 50 y.o. male.  The history is provided by medical records and the patient.  Abdominal Pain Associated symptoms: fever, nausea and shortness of breath   Fever Associated symptoms: nausea   Shortness of Breath Associated symptoms: abdominal pain and fever    50 year old male with history of arthritis, asthma, CHF, diabetes, hypertension, sleep apnea, presenting to the ED with multiple complaints.  States beginning this morning around 10 AM he began feeling poorly.  He reports feeling of fever with some sweats and chills, short of breath, some fleeting chest pain, and some mild abdominal pain.  He has felt nauseated but has not had any vomiting.  He denies any sick contacts and currently lives alone.  He was recently tested for COVID in the past 2 weeks and was negative.  He does have cardiac history and has not missed any of his home medications.    Past Medical History:  Diagnosis Date   Arthritis    Asthma    CHF (congestive heart failure) ([place])    Diabetes mellitus without complication ([place])    Hypertension    Sleep apnea     Patient Active Problem List   Diagnosis Date Noted   Cellulitis of buttock 10/17/2020   Uncontrolled type 2 diabetes mellitus with hyperglycemia ([place]) 10/17/2020   Obesity, Class III, BMI 40-49.9 (morbid obesity) ([place]) 10/17/2020   Tingling of both feet 12/21/2018   Cellulitis of foot, left 08/29/2018   Infected cyst of skin 07/04/2018   Type 2 diabetes mellitus without complication ([place]) 08/22/1599   SOB (shortness of breath) 04/24/2018   OSA (obstructive sleep apnea), severe 08/2018 04/24/2018   Bilateral primary osteoarthritis of knee 04/24/2018   Muscle cramping 04/24/2018   GERD  (gastroesophageal reflux disease) 04/24/2018   Epigastric pain 04/24/2018   Black stool 04/24/2018   Asthma 04/23/2018   Hyperlipidemia 04/23/2018   Chronic diastolic heart failure ([place])    Hypertension     History reviewed. No pertinent surgical history.     Family History  Problem Relation Age of Onset   Hypertension Mother    Liver disease Mother    Kidney disease Father     Social History   Tobacco Use   Smoking status: Former   Smokeless tobacco: Never  Scientific laboratory technician Use: Never used  Substance Use Topics   Alcohol use: No   Drug use: Not Currently    Types: Marijuana    Comment: occ    Home Medications Prior to Admission medications   Medication Sig Start Date End Date Taking? Authorizing Provider  albuterol (PROVENTIL HFA;VENTOLIN HFA) 108 (90 Base) MCG/ACT inhaler Inhale 1-2 puffs into the lungs every 6 (six) hours as needed for wheezing or shortness of breath. 04/24/18   Binnie Rail, MD  aspirin 325 MG tablet Take 650 mg by mouth every 6 (six) hours as needed for headache.    [provider]  B Complex Vitamins (B COMPLEX PO) Take 1 tablet by mouth daily.    [provider]  blood glucose meter kit and supplies KIT Dispense based on patient and insurance preference. Use up to four times daily as directed. (FOR E11.9). 12/21/18   Burns,  Claudina Lick, MD  carvedilol (COREG) 25 MG tablet Take 1 tablet (25 mg total) by mouth 2 (two) times daily with a meal. 05/31/18   Burns, Claudina Lick, MD  ciclesonide (ALVESCO) 160 MCG/ACT inhaler Inhale 1 puff into the lungs 2 (two) times daily.    [provider]  empagliflozin (JARDIANCE) 25 MG TABS tablet Take 0.5 tablets by mouth daily.    [provider]  FINASTERIDE PO Take 1 tablet by mouth 2 (two) times daily. Unable to verify dosing w/ pharmacy records    [provider]  furosemide (LASIX) 40 MG tablet Take 1 tablet (40 mg total) by mouth 2 (two) times daily. 04/20/18   Montine Circle, PA-C  ibuprofen (ADVIL) 200 MG tablet Take 400 mg by mouth every 6 (six) hours as needed for mild pain.    [provider]  insulin glargine (LANTUS SOLOSTAR) 100 UNIT/ML Solostar Pen Inject 30 Units into the skin daily. 10/20/20 12/19/20  Antonieta Pert, MD  losartan (COZAAR) 25 MG tablet Take 1 tablet (25 mg total) by mouth daily. 08/01/18 11/29/20  Elouise Munroe, MD  metFORMIN (GLUCOPHAGE) 500 MG tablet Take 1 tablet (500 mg total) by mouth 2 (two) times daily with a meal. 05/31/18   Burns, Claudina Lick, MD  Multiple Vitamin (MULTIVITAMIN) tablet Take 1 tablet by mouth daily.    [provider]  pantoprazole (PROTONIX) 40 MG tablet Take 1 tablet (40 mg total) by mouth daily. Patient taking differently: Take 40 mg by mouth daily as needed (acid reflux). 05/31/18   Binnie Rail, MD  rosuvastatin (CRESTOR) 40 MG tablet Take 1 tablet (40 mg total) by mouth daily. 04/27/18   Binnie Rail, MD  Tiotropium Bromide-Olodaterol (STIOLTO RESPIMAT) 2.5-2.5 MCG/ACT AERS Inhale 2 puffs into the lungs daily.    [provider]    Allergies    Patient has no known allergies.  Review of Systems   Review of Systems  Constitutional:  Positive for fever.  Respiratory:  Positive for shortness of breath.   Gastrointestinal:  Positive for abdominal pain and nausea.  All other systems reviewed and are negative.  Physical Exam Updated Vital Signs BP (!) 163/97 (BP Location: Left Arm)   Pulse 98   Temp 98 F (36.7 C) (Oral)   Resp (!) 23   Ht _0  (1.753 m)   Wt (!) 147.4 kg   SpO2 100%   BMI 47.99 kg/m   Physical Exam Vitals and nursing note reviewed.  Constitutional:      Appearance: He is well-developed.  HENT:     Head: Normocephalic and atraumatic.  Eyes:     Conjunctiva/sclera: Conjunctivae normal.     Pupils: Pupils are equal, round, and reactive to light.  Cardiovascular:     Rate and Rhythm: Normal rate and regular rhythm.     Heart sounds: Normal heart  sounds.  Pulmonary:     Effort: Pulmonary effort is normal.     Breath sounds: Normal breath sounds.  Abdominal:     General: Bowel sounds are normal.     Palpations: Abdomen is soft.     Tenderness: There is no abdominal tenderness. There is no rebound. Negative signs include McBurney's sign.     Comments: Obese abdomen but soft and non-tender  Musculoskeletal:        General: Normal range of motion.     Cervical back: Normal range of motion.     Comments: Trace edema at ankles  Skin:  General: Skin is warm and dry.  Neurological:     Mental Status: He is alert and oriented to person, place, and time.    ED Results / Procedures / Treatments   Labs (all labs ordered are listed, but only abnormal results are displayed) Labs Reviewed  COMPREHENSIVE METABOLIC PANEL - Abnormal; Notable for the following components:      Result Value   Glucose, Bld 179 (*)    Total Protein 8.2 (*)    Alkaline Phosphatase 37 (*)    All other components within normal limits  RESP PANEL BY RT-PCR (FLU A&B, COVID) ARPGX2  CBC WITH DIFFERENTIAL/PLATELET  LIPASE, BLOOD  BRAIN NATRIURETIC PEPTIDE  TROPONIN I (HIGH SENSITIVITY)    EKG None  Radiology DG Chest Port 1 View  Result Date: 10/29/2020 CLINICAL DATA:  Shortness of breath EXAM: PORTABLE CHEST 1 VIEW COMPARISON:  04/19/2018 FINDINGS: Right base atelectasis. Left lung clear. Heart is normal size. No effusions or acute bony abnormality. IMPRESSION: Right base atelectasis. Electronically Signed   By: Rolm Baptise M.D.   On: 10/29/2020 02:53    Procedures Procedures   Medications Ordered in ED Medications - No data to display  ED Course  I have reviewed the triage vital signs and the nursing notes.  Pertinent labs & imaging results that were available during my care of the patient were reviewed by me and considered in my medical decision making (see chart for details).    MDM Rules/Calculators/A&P                            50 year old male here with chest discomfort, shortness of breath, subjective fever, and congestion that began around 10 AM today.  He does have cardiac history.  He does report some mild swelling of the legs and was concerned about his heart failure.  His EKG is nonischemic.  His labs are overall reassuring.  There is no evidence of fluid overload or heart strain at present.  His COVID screen is negative.  This may ultimately be viral process.  As his symptoms just started today, may need to repeat COVID test in a few days if symptoms not improving.  He does have follow-up with his primary care doctor and his cardiologist within the next week.  He may return here for any new or acute changes.  Final Clinical Impression(s) / ED Diagnoses Final diagnoses:  Chest pain, unspecified type  Shortness of breath    Rx / DC Orders ED Discharge Orders     None        Larene Pickett, PA-C 10/29/20 0341    Fatima Blank, MD 10/29/20 [phone]

## 2020-10-29 NOTE — Discharge Instructions (Addendum)
Work-up today was reassuring. Follow-up at your appts next week as scheduled. If continue having symptoms, consider getting repeat covid testing. Return here for new concerns.

## 2022-03-29 ENCOUNTER — Emergency Department (HOSPITAL_BASED_OUTPATIENT_CLINIC_OR_DEPARTMENT_OTHER): Payer: Medicare (Managed Care)

## 2022-03-29 ENCOUNTER — Other Ambulatory Visit: Payer: Self-pay

## 2022-03-29 ENCOUNTER — Encounter (HOSPITAL_BASED_OUTPATIENT_CLINIC_OR_DEPARTMENT_OTHER): Payer: Self-pay

## 2022-03-29 ENCOUNTER — Emergency Department (HOSPITAL_BASED_OUTPATIENT_CLINIC_OR_DEPARTMENT_OTHER)
Admission: EM | Admit: 2022-03-29 | Discharge: 2022-03-30 | Disposition: A | Discharge status: Home / Self Care | Payer: Medicare (Managed Care) | Attending: Emergency Medicine | Admitting: Emergency Medicine

## 2022-03-29 DIAGNOSIS — Z7984 Long term (current) use of oral hypoglycemic drugs: Secondary | ICD-10-CM | POA: Insufficient documentation

## 2022-03-29 DIAGNOSIS — I509 Heart failure, unspecified: Secondary | ICD-10-CM | POA: Diagnosis not present

## 2022-03-29 DIAGNOSIS — Z794 Long term (current) use of insulin: Secondary | ICD-10-CM | POA: Insufficient documentation

## 2022-03-29 DIAGNOSIS — R0602 Shortness of breath: Secondary | ICD-10-CM | POA: Diagnosis not present

## 2022-03-29 DIAGNOSIS — R1084 Generalized abdominal pain: Secondary | ICD-10-CM | POA: Insufficient documentation

## 2022-03-29 DIAGNOSIS — Z7982 Long term (current) use of aspirin: Secondary | ICD-10-CM | POA: Insufficient documentation

## 2022-03-29 DIAGNOSIS — R000 Tachycardia, unspecified: Secondary | ICD-10-CM | POA: Insufficient documentation

## 2022-03-29 DIAGNOSIS — E119 Type 2 diabetes mellitus without complications: Secondary | ICD-10-CM | POA: Insufficient documentation

## 2022-03-29 LAB — CBC
HCT: 42.4 % (ref 39.0–52.0)
Hemoglobin: 13.8 g/dL (ref 13.0–17.0)
MCH: 26.9 pg (ref 26.0–34.0)
MCHC: 32.5 g/dL (ref 30.0–36.0)
MCV: 82.7 fL (ref 80.0–100.0)
Platelets: 188 10*3/uL (ref 150–400)
RBC: 5.13 MIL/uL (ref 4.22–5.81)
RDW: 13.9 % (ref 11.5–15.5)
WBC: 3.6 10*3/uL — ABNORMAL LOW (ref 4.0–10.5)
nRBC: 0 % (ref 0.0–0.2)

## 2022-03-29 LAB — URINALYSIS, ROUTINE W REFLEX MICROSCOPIC
Bilirubin Urine: NEGATIVE
Glucose, UA: 250 mg/dL — AB
Ketones, ur: NEGATIVE mg/dL
Leukocytes,Ua: NEGATIVE
Nitrite: NEGATIVE
Protein, ur: 100 mg/dL — AB
Specific Gravity, Urine: 1.025 (ref 1.005–1.030)
pH: 5.5 (ref 5.0–8.0)

## 2022-03-29 LAB — COMPREHENSIVE METABOLIC PANEL
ALT: 24 U/L (ref 0–44)
AST: 23 U/L (ref 15–41)
Albumin: 3.8 g/dL (ref 3.5–5.0)
Alkaline Phosphatase: 29 U/L — ABNORMAL LOW (ref 38–126)
Anion gap: 9 (ref 5–15)
BUN: 7 mg/dL (ref 6–20)
CO2: 23 mmol/L (ref 22–32)
Calcium: 8.6 mg/dL — ABNORMAL LOW (ref 8.9–10.3)
Chloride: 98 mmol/L (ref 98–111)
Creatinine, Ser: 0.91 mg/dL (ref 0.61–1.24)
GFR, Estimated: >60 mL/min (ref >60)
Glucose, Bld: 239 mg/dL — ABNORMAL HIGH (ref 70–99)
Potassium: 3.6 mmol/L (ref 3.5–5.1)
Sodium: 130 mmol/L — ABNORMAL LOW (ref 135–145)
Total Bilirubin: 0.6 mg/dL (ref 0.3–1.2)
Total Protein: 8.2 g/dL — ABNORMAL HIGH (ref 6.5–8.1)

## 2022-03-29 LAB — URINALYSIS, MICROSCOPIC (REFLEX): RBC / HPF: NONE SEEN RBC/hpf (ref 0–5)

## 2022-03-29 LAB — CBG MONITORING, ED: Glucose-Capillary: 230 mg/dL — ABNORMAL HIGH (ref 70–99)

## 2022-03-29 LAB — D-DIMER, QUANTITATIVE: D-Dimer, Quant: 0.75 ug/mL-FEU — ABNORMAL HIGH (ref 0.00–0.50)

## 2022-03-29 LAB — LIPASE, BLOOD: Lipase: 28 U/L (ref 11–51)

## 2022-03-29 MED ORDER — LORAZEPAM 1 MG PO TABS
1.0000 mg | ORAL_TABLET | Freq: Once | ORAL | Status: AC
  Administered 2022-03-29: 1 mg via ORAL
  Filled 2022-03-29: qty 1

## 2022-03-29 MED ORDER — IOHEXOL 350 MG/ML SOLN
125.0000 mL | Freq: Once | INTRAVENOUS | Status: AC | PRN
  Administered 2022-03-29: 125 mL via INTRAVENOUS

## 2022-03-29 MED ORDER — DIPHENHYDRAMINE HCL 50 MG/ML IJ SOLN
25.0000 mg | Freq: Once | INTRAMUSCULAR | Status: AC
  Administered 2022-03-29: 25 mg via INTRAVENOUS
  Filled 2022-03-29: qty 1

## 2022-03-29 MED ORDER — DICYCLOMINE HCL 20 MG PO TABS: 20.0000 mg | ORAL_TABLET | Freq: Two times a day (BID) | ORAL | 0 refills | Status: AC

## 2022-03-29 MED ORDER — FUROSEMIDE 40 MG PO TABS: 40.0000 mg | ORAL_TABLET | Freq: Two times a day (BID) | ORAL | 0 refills | Status: AC

## 2022-03-29 MED ORDER — METOCLOPRAMIDE HCL 5 MG/ML IJ SOLN
10.0000 mg | Freq: Once | INTRAMUSCULAR | Status: AC
  Administered 2022-03-29: 10 mg via INTRAVENOUS
  Filled 2022-03-29: qty 2

## 2022-03-29 MED ORDER — SODIUM CHLORIDE 0.9 % IV BOLUS
500.0000 mL | Freq: Once | INTRAVENOUS | Status: AC
  Administered 2022-03-29: 500 mL via INTRAVENOUS

## 2022-03-29 MED ORDER — POLYETHYLENE GLYCOL 3350 17 G PO PACK: 17.0000 g | PACK | Freq: Every day | ORAL | Status: DC

## 2022-03-29 MED ORDER — ONDANSETRON 4 MG PO TBDP: 4.0000 mg | ORAL_TABLET | Freq: Three times a day (TID) | ORAL | 0 refills | Status: AC | PRN

## 2022-03-29 NOTE — ED Triage Notes (Signed)
Pt c/o gradual diffuse "all over" abd pain x3 days, associated SHOB, states he feels "super full without putting food in my stomach." Denies NVD. Compliant w home medications Hx CHF, HTN

## 2022-03-29 NOTE — ED Notes (Signed)
Patient transported to CT 

## 2022-03-29 NOTE — ED Notes (Signed)
Pt reports wanting to leave, provider notified and at bedside to discuss POC with him

## 2022-03-29 NOTE — ED Provider Notes (Signed)
[facility]ERGENCY DEPARTMENT AT [facility] Provider Note   CSN: [id number] Arrival date & time: 03/29/22  1546     History  Chief Complaint  Patient presents with   Abdominal Pain   Shortness of Breath    William Benitez is a 52 y.o. male, history of diabetes, heart failure, who presents to the ED secondary to diffuse abdominal pain, swelling, and difficulty eating for the last 3 days associated with some shortness of breath.  He states that he has been short of breath, and has not been able to eat for the last 3 days, he states that every time he feels like he wants to eat his stomach turns, and he feels too full to eat.  States that he is having bowel movements, endorses that they are soft, and not hard.  Denies any swelling of his legs.  Has not been taking his insulin as prescribed, and he states he just has not felt like it.  Endorses some nausea, no vomiting.     Home Medications Prior to Admission medications   Medication Sig Start Date End Date Taking? Authorizing Provider  albuterol (PROVENTIL HFA;VENTOLIN HFA) 108 (90 Base) MCG/ACT inhaler Inhale 1-2 puffs into the lungs every 6 (six) hours as needed for wheezing or shortness of breath. 04/24/18   Binnie Rail, MD  aspirin 325 MG tablet Take 650 mg by mouth every 6 (six) hours as needed for headache.    [provider]  B Complex Vitamins (B COMPLEX PO) Take 1 tablet by mouth daily.    [provider]  blood glucose meter kit and supplies KIT Dispense based on patient and insurance preference. Use up to four times daily as directed. (FOR E11.9). 12/21/18   Binnie Rail, MD  carvedilol (COREG) 25 MG tablet Take 1 tablet (25 mg total) by mouth 2 (two) times daily with a meal. 05/31/18   Burns, Claudina Lick, MD  ciclesonide (ALVESCO) 160 MCG/ACT inhaler Inhale 1 puff into the lungs 2 (two) times daily.    [provider]  empagliflozin (JARDIANCE) 25 MG TABS tablet Take 0.5 tablets by mouth daily.     [provider]  FINASTERIDE PO Take 1 tablet by mouth 2 (two) times daily. Unable to verify dosing w/ pharmacy records    [provider]  furosemide (LASIX) 40 MG tablet Take 1 tablet (40 mg total) by mouth 2 (two) times daily. 04/20/18   Montine Circle, PA-C  ibuprofen (ADVIL) 200 MG tablet Take 400 mg by mouth every 6 (six) hours as needed for mild pain.    [provider]  insulin glargine (LANTUS SOLOSTAR) 100 UNIT/ML Solostar Pen Inject 30 Units into the skin daily. 10/20/20 12/19/20  Antonieta Pert, MD  losartan (COZAAR) 25 MG tablet Take 1 tablet (25 mg total) by mouth daily. 08/01/18 11/29/20  Elouise Munroe, MD  metFORMIN (GLUCOPHAGE) 500 MG tablet Take 1 tablet (500 mg total) by mouth 2 (two) times daily with a meal. 05/31/18   Burns, Claudina Lick, MD  Multiple Vitamin (MULTIVITAMIN) tablet Take 1 tablet by mouth daily.    [provider]  pantoprazole (PROTONIX) 40 MG tablet Take 1 tablet (40 mg total) by mouth daily. Patient taking differently: Take 40 mg by mouth daily as needed (acid reflux). 05/31/18   Binnie Rail, MD  rosuvastatin (CRESTOR) 40 MG tablet Take 1 tablet (40 mg total) by mouth daily. 04/27/18   Binnie Rail, MD  Tiotropium Bromide-Olodaterol (STIOLTO  RESPIMAT) 2.5-2.5 MCG/ACT AERS Inhale 2 puffs into the lungs daily.    [provider]      Allergies    Patient has no known allergies.    Review of Systems   Review of Systems  Respiratory:  Positive for shortness of breath.   Gastrointestinal:  Positive for abdominal pain and nausea. Negative for vomiting.    Physical Exam Updated Vital Signs BP (!) 145/89   Pulse (!) 112   Temp 98.7 F (37.1 C)   Resp 18   SpO2 98%  Physical Exam Vitals and nursing note reviewed.  Constitutional:      General: He is not in acute distress.    Appearance: He is well-developed.  HENT:     Head: Normocephalic and atraumatic.  Eyes:     Conjunctiva/sclera: Conjunctivae normal.   Cardiovascular:     Rate and Rhythm: Regular rhythm. Tachycardia present.     Heart sounds: No murmur heard. Pulmonary:     Effort: Pulmonary effort is normal. No respiratory distress.     Breath sounds: Normal breath sounds.  Abdominal:     General: Abdomen is protuberant. There is distension.     Palpations: Abdomen is soft.     Tenderness: There is generalized abdominal tenderness.  Musculoskeletal:        General: No swelling.     Cervical back: Neck supple.  Skin:    General: Skin is warm and dry.     Capillary Refill: Capillary refill takes less than 2 seconds.  Neurological:     Mental Status: He is alert.  Psychiatric:        Mood and Affect: Mood normal.     ED Results / Procedures / Treatments   Labs (all labs ordered are listed, but only abnormal results are displayed) Labs Reviewed  COMPREHENSIVE METABOLIC PANEL - Abnormal; Notable for the following components:      Result Value   Sodium 130 (*)    Glucose, Bld 239 (*)    Calcium 8.6 (*)    Total Protein 8.2 (*)    Alkaline Phosphatase 29 (*)    All other components within normal limits  CBC - Abnormal; Notable for the following components:   WBC 3.6 (*)    All other components within normal limits  URINALYSIS, ROUTINE W REFLEX MICROSCOPIC - Abnormal; Notable for the following components:   Glucose, UA 250 (*)    Hgb urine dipstick TRACE (*)    Protein, ur 100 (*)    All other components within normal limits  D-DIMER, QUANTITATIVE - Abnormal; Notable for the following components:   D-Dimer, Quant 0.75 (*)    All other components within normal limits  URINALYSIS, MICROSCOPIC (REFLEX) - Abnormal; Notable for the following components:   Bacteria, UA RARE (*)    All other components within normal limits  CBG MONITORING, ED - Abnormal; Notable for the following components:   Glucose-Capillary 230 (*)    All other components within normal limits  LIPASE, BLOOD    EKG EKG  Interpretation  Date/Time:  Monday March 29 2022 15:57:13 EST Ventricular Rate:  112 PR Interval:  146 QRS Duration: 82 QT Interval:  312 QTC Calculation: 426 R Axis:   82 Text Interpretation: Sinus tachycardia Right atrial enlargement Probable LVH with secondary repol abnrm Confirmed by Nanda Quinton [phone]) on 03/29/2022 4:05:52 PM  Radiology No results found.  Procedures Procedures    Medications Ordered in ED Medications  sodium chloride 0.9 % bolus  500 mL (500 mLs Intravenous New Bag/Given 03/29/22 1705)  metoCLOPramide (REGLAN) injection 10 mg (10 mg Intravenous Given 03/29/22 1703)  diphenhydrAMINE (BENADRYL) injection 25 mg (25 mg Intravenous Given 03/29/22 1703)  LORazepam (ATIVAN) tablet 1 mg (1 mg Oral Given 03/29/22 1743)  iohexol (OMNIPAQUE) 350 MG/ML injection 125 mL (125 mLs Intravenous Contrast Given 03/29/22 1746)  LORazepam (ATIVAN) tablet 1 mg (1 mg Oral Given 03/29/22 1826)    ED Course/ Medical Decision Making/ A&P                             Medical Decision Making Patient is a 52 year old male, here for severe shortness of breath, abdominal pain, for the last 3 days.  He endorses some nausea, but no vomiting or diarrhea.  Is having normal bowel movements, but difficulty passing gas.  His stomach is distended, old versus protuberant we will obtain a CT abdomen pelvis.  Additionally he is tachycardic, and complaining of severe shortness of breath we will obtain an dimer for further evaluation.  He has not been taking his insulin as he states he just does not feel like it, denies any SI HI however, and given his symptoms we will give him some Reglan to see if it is secondary to his gastroparesis possibly?Marland Kitchen  Amount and/or Complexity of Data Reviewed Labs: ordered.    Details: Elevated dimer Radiology: ordered. Discussion of management or test interpretation with external provider(s): Handoff provided to [facility], for follow-up on CT abdomen pelvis, CT PE study for further  evaluation.  He will disposition the patient accordingly.  Risk Prescription drug management.    Final Clinical Impression(s) / ED Diagnoses Final diagnoses:  Generalized abdominal pain  Shortness of breath    Rx / DC Orders ED Discharge Orders     None         Breelyn Icard, Si Gaul, PA 03/29/22 1900    Lajean Saver, MD 03/29/22 2308

## 2022-03-29 NOTE — Discharge Instructions (Addendum)
Your workup today was normal.  No concerning cause of your abdominal pain or shortness of breath.  Please follow-up with your primary care doctor soon as possible.  You are able to drink water in the emergency department without any issue.  I did send in a couple medications for you for nausea as well as for abdominal pain.  Take Bentyl as needed.  He also stated you are out of your Lasix which is your fluid medicine.  This could be playing a role into why you are somewhat short of breath.  No significant signs of volume overload on exam.  However I did send in the refill for your Lasix.

## 2022-03-29 NOTE — ED Provider Notes (Signed)
52 year old male presents today for evaluation of generalized abdominal pain, abdominal distention, as well as some shortness of breath that has been ongoing for the past 3 days.  Denies worsening shortness of breath.  Has history of sleep apnea but does not use CPAP.  States he has chronic 3-4 pillow orthopnea.  No recent change.  No PND.  No peripheral edema.  Has not been compliant with his home Lasix as he has run out.  States he used to be on an antihypertensive however he was taken off of it because his blood pressure has been doing well without it.  He does follow-up with his primary care provider who is aware he has not taken his antihypertensive.  Patient was obtained to me in signout.  Please see note from [facility] for full HPI.  Patient at the time of signout is awaiting CT abdomen pelvis, and CT angio PE study. During my evaluation of patient prior to obtaining the scans he states he has had improvement in his abdominal pain and would like to p.o. challenge. Physical Exam  BP (!) 167/105   Pulse (!) 109   Temp 98.7 F (37.1 C)   Resp 18   SpO2 96%     Procedures  Procedures  ED Course / MDM    Medical Decision Making Amount and/or Complexity of Data Reviewed Labs: ordered. Radiology: ordered.  Risk Prescription drug management.   CT abdomen pelvis without acute intra-abdominal etiology of his abdominal pain.  CT angio PE study negative for PE.  No other acute cardial pulm process.  Patient tolerated p.o. intake without much difficulty.  Patient while sleeping did have desats into the mid 80s.  Patient was woken up by me and had a discussion regarding this.  He while awake still had another 10 seconds of desats into the mid 80s before recovering his sats to upper 90s.  Based off of this we did discuss admission for observation however following this discussion patient declines this and states he will follow-up with his PCP to set up a sleep study as he feels this is likely due  to his sleep apnea.  Feel this is reasonable.  Refill on patient's Lasix given as he has been out of this medication.  Bentyl and Zofran prescribed.  Patient is appropriate for discharge.  Discharged in stable condition.  Return precautions discussed.       Evlyn Courier, PA-C 03/29/22 2342    Lajean Saver, MD 03/30/22 [phone]

## 2022-03-29 NOTE — ED Notes (Signed)
Pt is not awake at this time. Will have to get temp later

## 2022-03-29 NOTE — ED Notes (Signed)
Pt soiled himself. Linen and gown changed. Peri care completed. PA notified that pt is still not ready to go to CT due to anxiety.

## 2022-05-31 IMAGING — DX DG CHEST 1V PORT
1 series · 2 of 2 positions shown · non-contrast
Comparison: 04/19/2018

CLINICAL DATA: Shortness of breath

EXAM:
PORTABLE CHEST 1 VIEW

[Series 1: chest ap · 0.14mm/px · 2 of 2 slices shown]
[im 1/2]
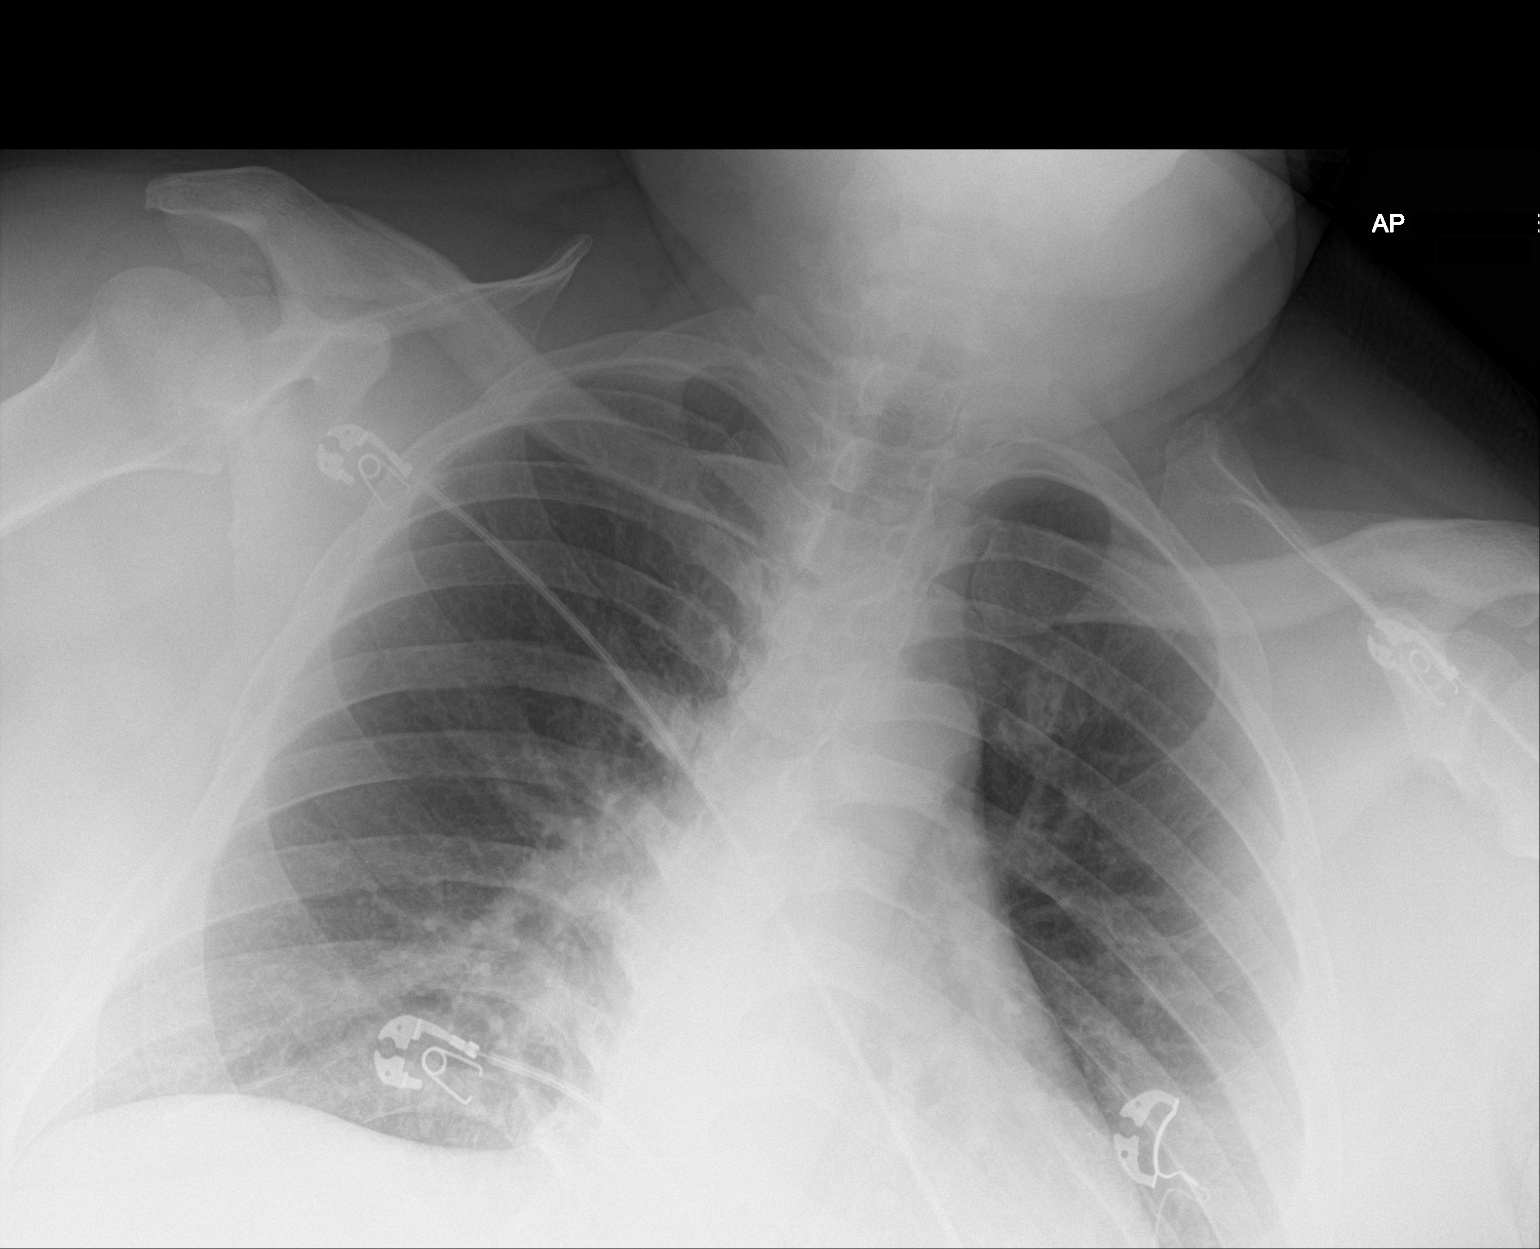
[im 2/2]
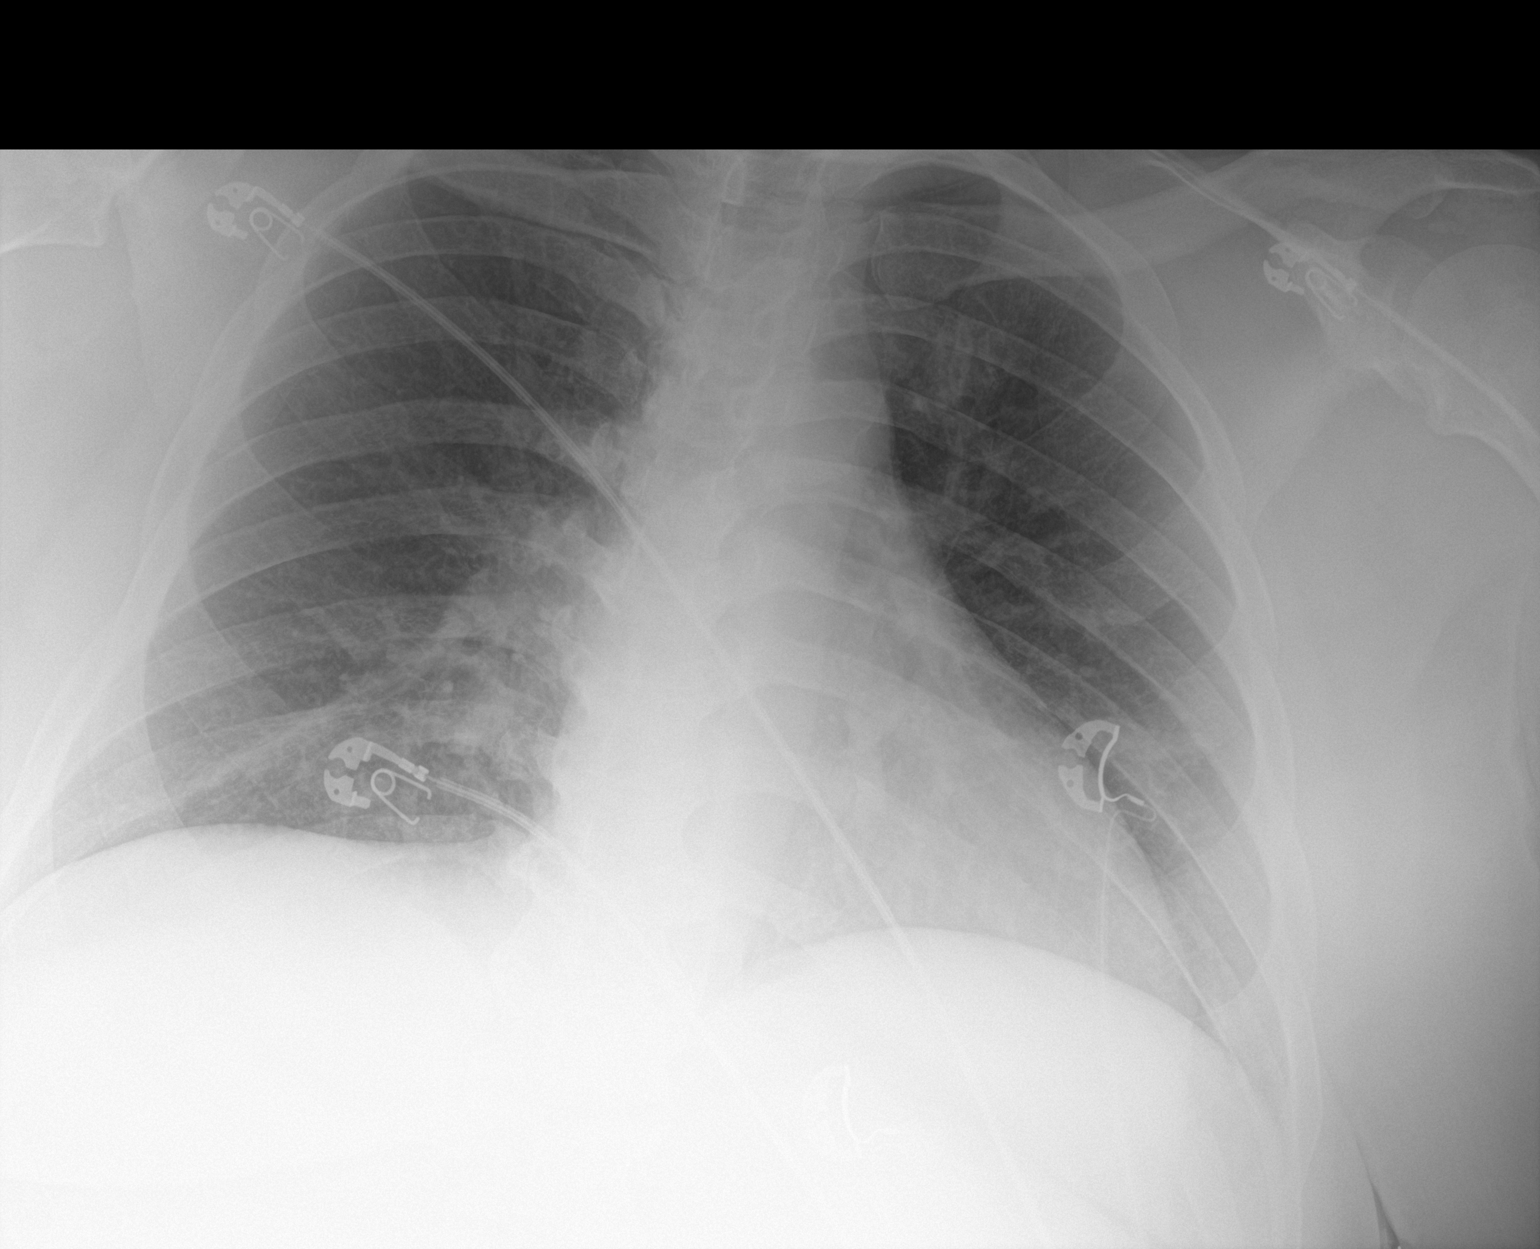

[2 of 2 positions shown; findings below may reference images not displayed]

FINDINGS: Right base atelectasis. Left lung clear. Heart is normal size. No
effusions or acute bony abnormality.
IMPRESSION: Right base atelectasis.

## 2023-08-29 ENCOUNTER — Encounter (HOSPITAL_BASED_OUTPATIENT_CLINIC_OR_DEPARTMENT_OTHER): Payer: Self-pay

## 2023-08-29 ENCOUNTER — Emergency Department (HOSPITAL_BASED_OUTPATIENT_CLINIC_OR_DEPARTMENT_OTHER): Admitting: Radiology

## 2023-08-29 ENCOUNTER — Other Ambulatory Visit: Payer: Self-pay

## 2023-08-29 DIAGNOSIS — M25562 Pain in left knee: Secondary | ICD-10-CM | POA: Diagnosis present

## 2023-08-29 LAB — CBG MONITORING, ED: Glucose-Capillary: 155 mg/dL — ABNORMAL HIGH (ref 70–99)

## 2023-08-29 NOTE — ED Triage Notes (Signed)
 Pt presents via POV c/o left knee pain x4 days. Denies injury.

## 2023-08-30 ENCOUNTER — Emergency Department (HOSPITAL_BASED_OUTPATIENT_CLINIC_OR_DEPARTMENT_OTHER)
Admission: EM | Admit: 2023-08-30 | Discharge: 2023-08-30 | Disposition: A | Discharge status: Home / Self Care | Attending: Emergency Medicine | Admitting: Emergency Medicine

## 2023-08-30 DIAGNOSIS — M25562 Pain in left knee: Secondary | ICD-10-CM

## 2023-08-30 MED ORDER — HYDROCODONE-ACETAMINOPHEN 5-325 MG PO TABS: 1.0000 | ORAL_TABLET | Freq: Four times a day (QID) | ORAL | 0 refills | Status: AC | PRN

## 2023-08-30 MED ORDER — HYDROCODONE-ACETAMINOPHEN 5-325 MG PO TABS
1.0000 | ORAL_TABLET | Freq: Once | ORAL | Status: AC
  Administered 2023-08-30: 1 via ORAL
  Filled 2023-08-30: qty 1

## 2023-08-30 NOTE — ED Provider Notes (Signed)
 [place] [facility] AT [facility]  Provider Note  CSN: [id number] Arrival date & time: 08/29/23 2335  History Chief Complaint  Patient presents with   Knee Pain    William Benitez is a 53 y.o. male with remote history of femur fracture repair reports 4-5 days of L knee pain, worse with movement. No injury. Has not taken anything for pain. No fevers.    Home Medications Prior to Admission medications   Medication Sig Start Date End Date Taking? Authorizing Provider  HYDROcodone -acetaminophen  (NORCO/VICODIN) 5-325 MG tablet Take 1 tablet by mouth every 6 (six) hours as needed for severe pain (pain score 7-10). 08/30/23  Yes Roselyn Carlin NOVAK, MD  albuterol  (PROVENTIL  HFA;VENTOLIN  HFA) 108 (90 Base) MCG/ACT inhaler Inhale 1-2 puffs into the lungs every 6 (six) hours as needed for wheezing or shortness of breath. 04/24/18   Geofm Glade PARAS, MD  aspirin  325 MG tablet Take 650 mg by mouth every 6 (six) hours as needed for headache.    [provider]  B Complex Vitamins (B COMPLEX PO) Take 1 tablet by mouth daily.    [provider]  blood glucose meter kit and supplies KIT Dispense based on patient and insurance preference. Use up to four times daily as directed. (FOR E11.9). 12/21/18   Geofm Glade PARAS, MD  carvedilol  (COREG ) 25 MG tablet Take 1 tablet (25 mg total) by mouth 2 (two) times daily with a meal. 05/31/18   Burns, Glade PARAS, MD  ciclesonide  (ALVESCO ) 160 MCG/ACT inhaler Inhale 1 puff into the lungs 2 (two) times daily.    [provider]  dicyclomine  (BENTYL ) 20 MG tablet Take 1 tablet (20 mg total) by mouth 2 (two) times daily. 03/29/22   Hildegard Loge, PA-C  empagliflozin (JARDIANCE) 25 MG TABS tablet Take 0.5 tablets by mouth daily.    [provider]  FINASTERIDE PO Take 1 tablet by mouth 2 (two) times daily. Unable to verify dosing w/ pharmacy records    [provider]  furosemide  (LASIX ) 40 MG tablet Take 1 tablet (40 mg  total) by mouth 2 (two) times daily. 03/29/22   Hildegard, Amjad, PA-C  ibuprofen  (ADVIL ) 200 MG tablet Take 400 mg by mouth every 6 (six) hours as needed for mild pain.    [provider]  insulin  glargine (LANTUS  SOLOSTAR) 100 UNIT/ML Solostar Pen Inject 30 Units into the skin daily. 10/20/20 12/19/20  Christobal Guadalajara, MD  losartan  (COZAAR ) 25 MG tablet Take 1 tablet (25 mg total) by mouth daily. 08/01/18 11/29/20  Acharya, Gayatri A, MD  metFORMIN  (GLUCOPHAGE ) 500 MG tablet Take 1 tablet (500 mg total) by mouth 2 (two) times daily with a meal. 05/31/18   Burns, Glade PARAS, MD  Multiple Vitamin (MULTIVITAMIN) tablet Take 1 tablet by mouth daily.    [provider]  ondansetron  (ZOFRAN -ODT) 4 MG disintegrating tablet Take 1 tablet (4 mg total) by mouth every 8 (eight) hours as needed. 03/29/22   Hildegard Loge, PA-C  pantoprazole  (PROTONIX ) 40 MG tablet Take 1 tablet (40 mg total) by mouth daily. Patient taking differently: Take 40 mg by mouth daily as needed (acid reflux). 05/31/18   Geofm Glade PARAS, MD  rosuvastatin  (CRESTOR ) 40 MG tablet Take 1 tablet (40 mg total) by mouth daily. 04/27/18   Geofm Glade PARAS, MD  Tiotropium Bromide-Olodaterol (STIOLTO RESPIMAT) 2.5-2.5 MCG/ACT AERS Inhale 2 puffs into the lungs daily.    [provider]     Allergies  Patient has no known allergies.   Review of Systems   Review of Systems Please see HPI for pertinent positives and negatives  Physical Exam BP 130/78 (BP Location: Right Arm)   Pulse 71   Temp 98.9 F (37.2 C)   Resp 19   Ht 5' 9 (1.753 m)   Wt (!) 140.6 kg   SpO2 97%   BMI 45.78 kg/m   Physical Exam Vitals and nursing note reviewed.  HENT:     Head: Normocephalic.     Nose: Nose normal.  Eyes:     Extraocular Movements: Extraocular movements intact.  Pulmonary:     Effort: Pulmonary effort is normal.  Musculoskeletal:        General: Tenderness present. No swelling or deformity. Normal range of motion.     Cervical back:  Neck supple.     Comments: No erythema or warmth  Skin:    Findings: No rash (on exposed skin).  Neurological:     Mental Status: He is alert and oriented to person, place, and time.  Psychiatric:        Mood and Affect: Mood normal.     ED Results / Procedures / Treatments   EKG None  Procedures Procedures  Medications Ordered in the ED Medications  HYDROcodone -acetaminophen  (NORCO/VICODIN) 5-325 MG per tablet 1 tablet (has no administration in time range)    Initial Impression and Plan  Patient here with atraumatic L knee pain, no signs of infection or inflammatory process. I personally viewed the images from radiology studies and agree with radiologist interpretation: Xray neg for fracture or other acute process. Will place in knee brace if we have one that will fit, Rx for pain meds and ortho follow up if not improving. PCP follow up, RTED for any other concerns.    ED Course       MDM Rules/Calculators/A&P Medical Decision Making Problems Addressed: Acute pain of left knee: acute illness or injury  Amount and/or Complexity of Data Reviewed Radiology: ordered and independent interpretation performed. Decision-making details documented in ED Course.  Risk Prescription drug management.     Final Clinical Impression(s) / ED Diagnoses Final diagnoses:  Acute pain of left knee    Rx / DC Orders ED Discharge Orders          Ordered    HYDROcodone -acetaminophen  (NORCO/VICODIN) 5-325 MG tablet  Every 6 hours PRN        08/30/23 0317             Roselyn Carlin NOVAK, MD 08/30/23 [phone]
